# Patient Record
Sex: Female | Born: 1950 | Race: White | Hispanic: No | Smoking: Never smoker
Health system: Southern US, Community
[De-identification: ages and names within clinical notes are randomized; demographics above are authoritative.]

## PROBLEM LIST (undated history)

## (undated) DIAGNOSIS — G309 Alzheimer's disease, unspecified: Secondary | ICD-10-CM

## (undated) DIAGNOSIS — N289 Disorder of kidney and ureter, unspecified: Secondary | ICD-10-CM

## (undated) DIAGNOSIS — F0280 Dementia in other diseases classified elsewhere without behavioral disturbance: Secondary | ICD-10-CM

## (undated) HISTORY — PX: REPLACEMENT TOTAL KNEE: SUR1224

---

## 2000-05-07 ENCOUNTER — Encounter: Payer: Self-pay | Admitting: Internal Medicine

## 2000-05-07 ENCOUNTER — Ambulatory Visit (HOSPITAL_COMMUNITY): Admission: RE | Admit: 2000-05-07 | Discharge: 2000-05-07 | Payer: Self-pay | Admitting: Internal Medicine

## 2000-07-05 ENCOUNTER — Ambulatory Visit (HOSPITAL_COMMUNITY): Admission: RE | Admit: 2000-07-05 | Discharge: 2000-07-05 | Payer: Self-pay | Admitting: General Surgery

## 2000-09-02 ENCOUNTER — Encounter: Payer: Self-pay | Admitting: *Deleted

## 2000-09-02 ENCOUNTER — Emergency Department (HOSPITAL_COMMUNITY): Admission: EM | Admit: 2000-09-02 | Discharge: 2000-09-02 | Payer: Self-pay | Admitting: *Deleted

## 2000-10-08 ENCOUNTER — Ambulatory Visit (HOSPITAL_COMMUNITY): Admission: RE | Admit: 2000-10-08 | Discharge: 2000-10-08 | Payer: Self-pay | Admitting: Internal Medicine

## 2000-10-08 ENCOUNTER — Encounter: Payer: Self-pay | Admitting: Internal Medicine

## 2001-03-04 ENCOUNTER — Ambulatory Visit (HOSPITAL_COMMUNITY): Admission: RE | Admit: 2001-03-04 | Discharge: 2001-03-04 | Payer: Self-pay | Admitting: Internal Medicine

## 2001-03-04 ENCOUNTER — Encounter: Payer: Self-pay | Admitting: Internal Medicine

## 2001-07-27 ENCOUNTER — Encounter (HOSPITAL_COMMUNITY): Admission: RE | Admit: 2001-07-27 | Discharge: 2001-08-26 | Payer: Self-pay | Admitting: Internal Medicine

## 2001-09-08 ENCOUNTER — Ambulatory Visit (HOSPITAL_COMMUNITY): Admission: RE | Admit: 2001-09-08 | Discharge: 2001-09-08 | Payer: Self-pay | Admitting: Pulmonary Disease

## 2001-10-19 ENCOUNTER — Emergency Department (HOSPITAL_COMMUNITY): Admission: EM | Admit: 2001-10-19 | Discharge: 2001-10-19 | Payer: Self-pay | Admitting: Emergency Medicine

## 2001-10-19 ENCOUNTER — Encounter: Payer: Self-pay | Admitting: Internal Medicine

## 2001-11-04 ENCOUNTER — Ambulatory Visit (HOSPITAL_COMMUNITY): Admission: RE | Admit: 2001-11-04 | Discharge: 2001-11-04 | Payer: Self-pay | Admitting: Internal Medicine

## 2001-11-04 ENCOUNTER — Encounter: Payer: Self-pay | Admitting: Internal Medicine

## 2002-01-05 ENCOUNTER — Ambulatory Visit (HOSPITAL_COMMUNITY): Admission: RE | Admit: 2002-01-05 | Discharge: 2002-01-05 | Payer: Self-pay | Admitting: Pulmonary Disease

## 2002-02-28 ENCOUNTER — Ambulatory Visit (HOSPITAL_COMMUNITY): Admission: RE | Admit: 2002-02-28 | Discharge: 2002-02-28 | Payer: Self-pay | Admitting: Internal Medicine

## 2002-03-06 ENCOUNTER — Encounter: Payer: Self-pay | Admitting: General Surgery

## 2002-03-06 ENCOUNTER — Ambulatory Visit (HOSPITAL_COMMUNITY): Admission: RE | Admit: 2002-03-06 | Discharge: 2002-03-06 | Payer: Self-pay | Admitting: General Surgery

## 2002-03-24 ENCOUNTER — Ambulatory Visit (HOSPITAL_COMMUNITY): Admission: RE | Admit: 2002-03-24 | Discharge: 2002-03-24 | Payer: Self-pay | Admitting: Internal Medicine

## 2002-03-24 ENCOUNTER — Encounter: Payer: Self-pay | Admitting: Internal Medicine

## 2002-04-04 ENCOUNTER — Ambulatory Visit (HOSPITAL_COMMUNITY): Admission: RE | Admit: 2002-04-04 | Discharge: 2002-04-04 | Payer: Self-pay | Admitting: Internal Medicine

## 2002-04-04 ENCOUNTER — Encounter: Payer: Self-pay | Admitting: Internal Medicine

## 2002-10-17 ENCOUNTER — Encounter: Payer: Self-pay | Admitting: Orthopedic Surgery

## 2003-03-14 ENCOUNTER — Ambulatory Visit (HOSPITAL_COMMUNITY): Admission: RE | Admit: 2003-03-14 | Discharge: 2003-03-14 | Payer: Self-pay | Admitting: Obstetrics and Gynecology

## 2003-04-25 ENCOUNTER — Ambulatory Visit (HOSPITAL_COMMUNITY): Admission: RE | Admit: 2003-04-25 | Discharge: 2003-04-25 | Payer: Self-pay | Admitting: Surgery

## 2003-09-18 ENCOUNTER — Ambulatory Visit (HOSPITAL_COMMUNITY): Admission: RE | Admit: 2003-09-18 | Discharge: 2003-09-18 | Payer: Self-pay | Admitting: General Surgery

## 2003-09-20 ENCOUNTER — Ambulatory Visit (HOSPITAL_COMMUNITY): Admission: RE | Admit: 2003-09-20 | Discharge: 2003-09-20 | Payer: Self-pay | Admitting: General Surgery

## 2004-03-18 ENCOUNTER — Ambulatory Visit (HOSPITAL_COMMUNITY): Admission: RE | Admit: 2004-03-18 | Discharge: 2004-03-18 | Payer: Self-pay | Admitting: Internal Medicine

## 2004-03-26 ENCOUNTER — Ambulatory Visit (HOSPITAL_COMMUNITY): Admission: RE | Admit: 2004-03-26 | Discharge: 2004-03-26 | Payer: Self-pay | Admitting: Internal Medicine

## 2004-03-31 ENCOUNTER — Ambulatory Visit: Payer: Self-pay | Admitting: Internal Medicine

## 2004-04-03 ENCOUNTER — Ambulatory Visit: Payer: Self-pay | Admitting: Internal Medicine

## 2004-04-04 ENCOUNTER — Ambulatory Visit (HOSPITAL_COMMUNITY): Admission: RE | Admit: 2004-04-04 | Discharge: 2004-04-04 | Payer: Self-pay | Admitting: Internal Medicine

## 2004-04-10 ENCOUNTER — Ambulatory Visit: Payer: Self-pay | Admitting: Internal Medicine

## 2004-04-16 ENCOUNTER — Ambulatory Visit (HOSPITAL_COMMUNITY): Admission: RE | Admit: 2004-04-16 | Discharge: 2004-04-16 | Payer: Self-pay | Admitting: Internal Medicine

## 2004-05-01 ENCOUNTER — Ambulatory Visit: Payer: Self-pay | Admitting: Internal Medicine

## 2004-05-15 ENCOUNTER — Ambulatory Visit: Payer: Self-pay | Admitting: Orthopedic Surgery

## 2004-07-02 ENCOUNTER — Ambulatory Visit: Payer: Self-pay | Admitting: Orthopedic Surgery

## 2004-08-27 ENCOUNTER — Encounter: Admission: RE | Admit: 2004-08-27 | Discharge: 2004-08-27 | Payer: Self-pay | Admitting: Orthopedic Surgery

## 2004-08-28 ENCOUNTER — Ambulatory Visit (HOSPITAL_BASED_OUTPATIENT_CLINIC_OR_DEPARTMENT_OTHER): Admission: RE | Admit: 2004-08-28 | Discharge: 2004-08-28 | Payer: Self-pay | Admitting: Orthopedic Surgery

## 2004-08-28 ENCOUNTER — Ambulatory Visit (HOSPITAL_COMMUNITY): Admission: RE | Admit: 2004-08-28 | Discharge: 2004-08-28 | Payer: Self-pay | Admitting: Orthopedic Surgery

## 2004-09-29 ENCOUNTER — Encounter (HOSPITAL_COMMUNITY): Admission: RE | Admit: 2004-09-29 | Discharge: 2004-10-18 | Payer: Self-pay | Admitting: Orthopedic Surgery

## 2004-12-18 ENCOUNTER — Ambulatory Visit: Payer: Self-pay | Admitting: Internal Medicine

## 2004-12-19 ENCOUNTER — Ambulatory Visit (HOSPITAL_COMMUNITY): Admission: RE | Admit: 2004-12-19 | Discharge: 2004-12-19 | Payer: Self-pay | Admitting: Internal Medicine

## 2005-02-13 ENCOUNTER — Encounter (INDEPENDENT_AMBULATORY_CARE_PROVIDER_SITE_OTHER): Payer: Self-pay | Admitting: General Surgery

## 2005-02-14 ENCOUNTER — Inpatient Hospital Stay (HOSPITAL_COMMUNITY): Admission: RE | Admit: 2005-02-14 | Discharge: 2005-02-15 | Payer: Self-pay | Admitting: General Surgery

## 2005-03-09 ENCOUNTER — Ambulatory Visit (HOSPITAL_COMMUNITY): Admission: RE | Admit: 2005-03-09 | Discharge: 2005-03-09 | Payer: Self-pay | Admitting: Internal Medicine

## 2005-06-24 ENCOUNTER — Emergency Department (HOSPITAL_COMMUNITY): Admission: EM | Admit: 2005-06-24 | Discharge: 2005-06-24 | Payer: Self-pay | Admitting: Emergency Medicine

## 2005-07-30 ENCOUNTER — Ambulatory Visit: Payer: Self-pay | Admitting: Orthopedic Surgery

## 2005-08-27 ENCOUNTER — Ambulatory Visit: Payer: Self-pay | Admitting: Orthopedic Surgery

## 2005-09-02 ENCOUNTER — Ambulatory Visit (HOSPITAL_COMMUNITY): Admission: RE | Admit: 2005-09-02 | Discharge: 2005-09-02 | Payer: Self-pay | Admitting: Orthopedic Surgery

## 2005-09-10 ENCOUNTER — Ambulatory Visit: Payer: Self-pay | Admitting: Orthopedic Surgery

## 2005-10-14 ENCOUNTER — Ambulatory Visit (HOSPITAL_COMMUNITY): Admission: RE | Admit: 2005-10-14 | Discharge: 2005-10-14 | Payer: Self-pay | Admitting: Obstetrics & Gynecology

## 2006-03-18 ENCOUNTER — Ambulatory Visit (HOSPITAL_COMMUNITY): Admission: RE | Admit: 2006-03-18 | Discharge: 2006-03-18 | Payer: Self-pay | Admitting: Obstetrics and Gynecology

## 2006-04-02 ENCOUNTER — Encounter: Admission: RE | Admit: 2006-04-02 | Discharge: 2006-04-02 | Payer: Self-pay | Admitting: Obstetrics and Gynecology

## 2006-07-26 IMAGING — RF DG UGI W/ SMALL BOWEL
15 of 24 series · 15 of 24 positions shown · non-contrast
Comparison: none

CLINICAL DATA: Diffuse lower abdominal pain. Chronic diarrhea. Clinical
diagnosis of irritable bowel syndrome.

UPPER GI SERIES W/ HIGH-DENSITY BARIUM AND KUB AND SMALL BOWEL SERIES:
TECHNIQUE: After obtaining a scout radiograph, a double-contrast upper GI
series was performed using both high-density and thin barium. The small bowel
was then evaluated, including the terminal ileum.

[Series 1: run · 1 of 1 slices shown (1 of 15)]
[im 1/1]
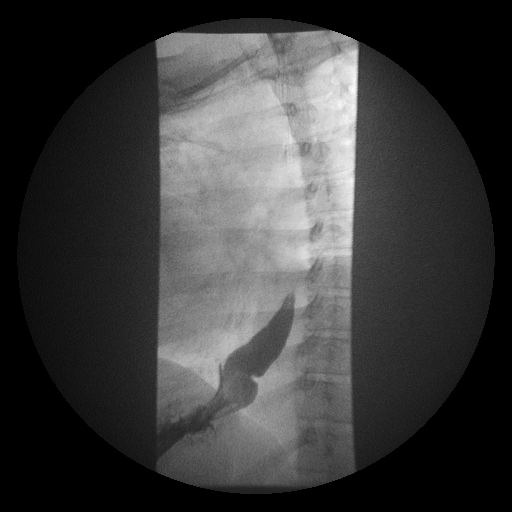

[Series 3: run · 1 of 1 slices shown (2 of 15)]
[im 1/1]
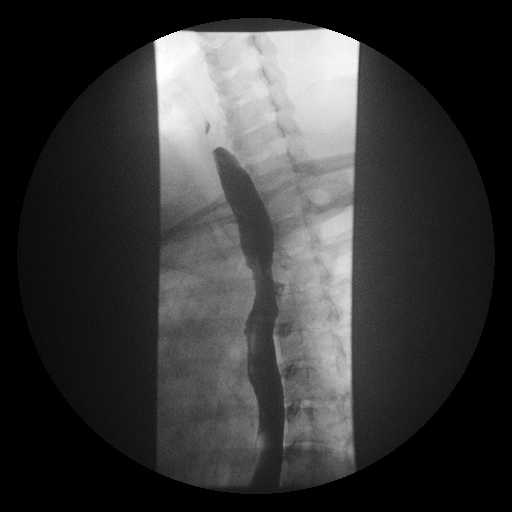

[Series 5: run · 1 of 1 slices shown (3 of 15)]
[im 1/1]
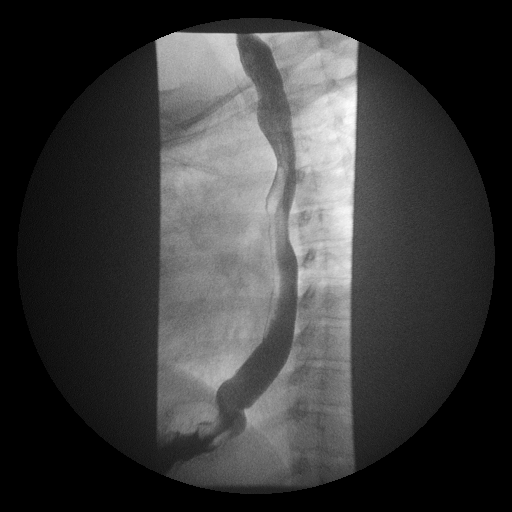

[Series 6: run · 1 of 1 slices shown (4 of 15)]
[im 1/1]
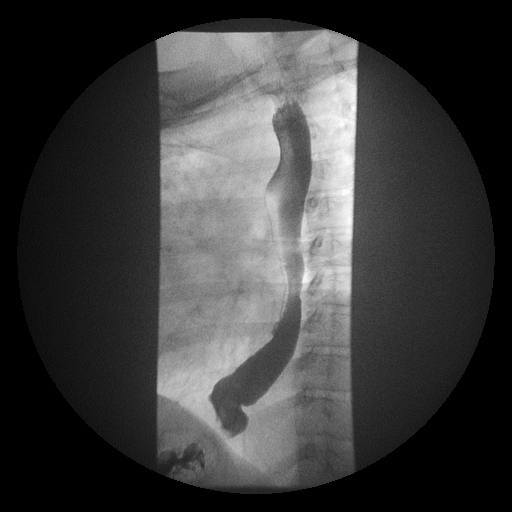

[Series 8: run · 1 of 1 slices shown (5 of 15)]
[im 1/1]
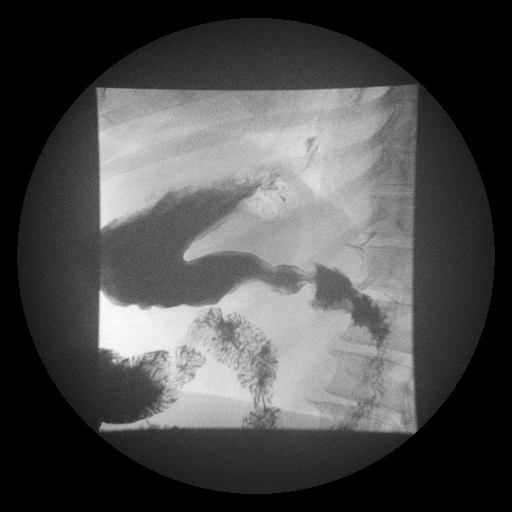

[Series 9: run · 1 of 1 slices shown (6 of 15)]
[im 1/1]
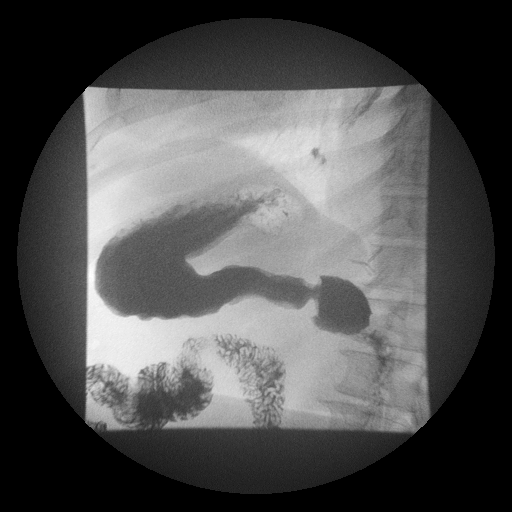

[Series 11: run · 1 of 1 slices shown (7 of 15)]
[im 1/1]
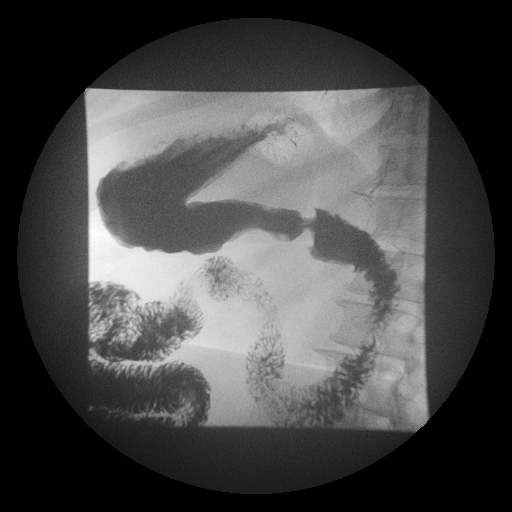

[Series 13: run · 1 of 1 slices shown (8 of 15)]
[im 1/1]
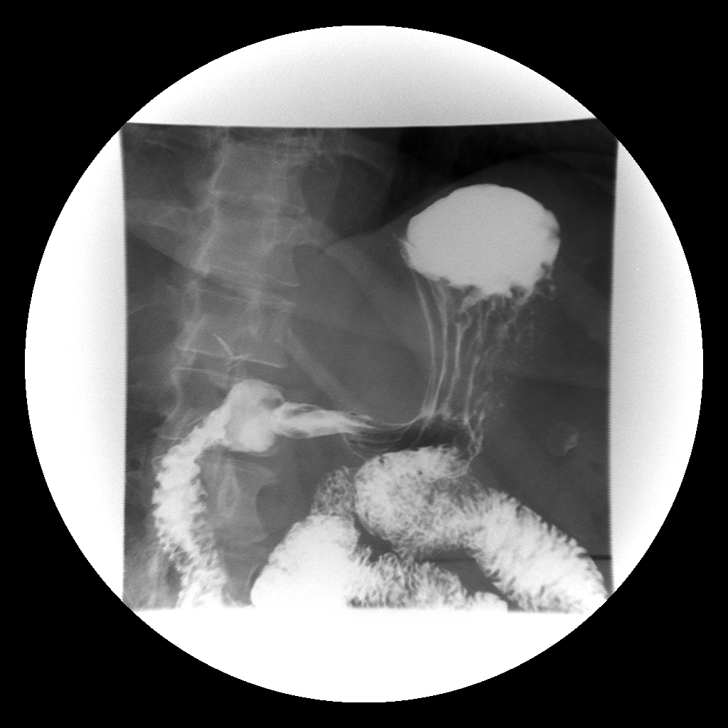

[Series 14: run · 1 of 1 slices shown (9 of 15)]
[im 1/1]
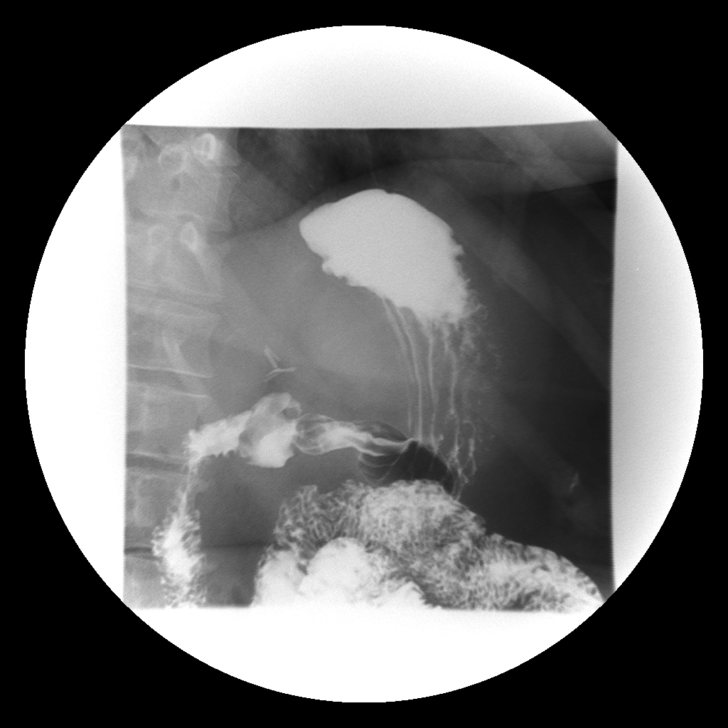

[Series 16: run · 1 of 1 slices shown (10 of 15)]
[im 1/1]
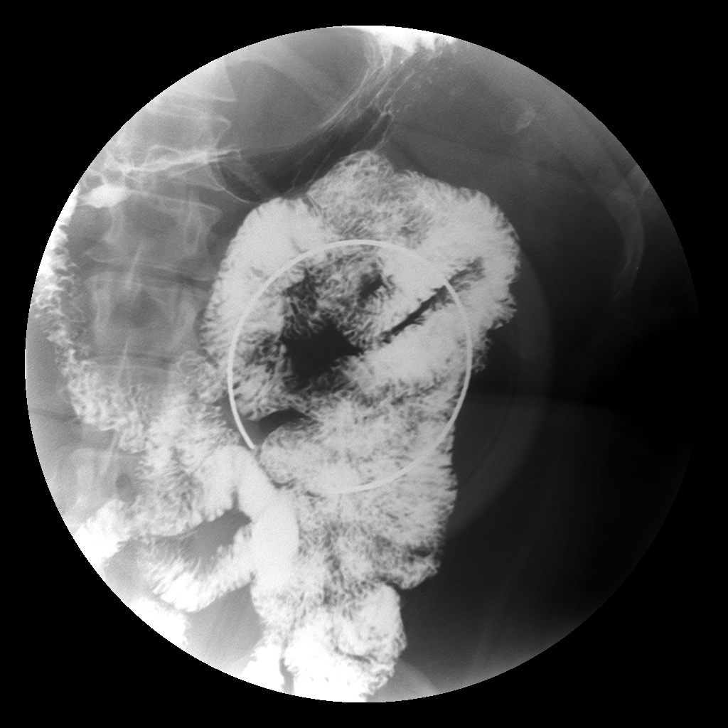

[Series 17: run · 1 of 1 slices shown (11 of 15)]
[im 1/1]
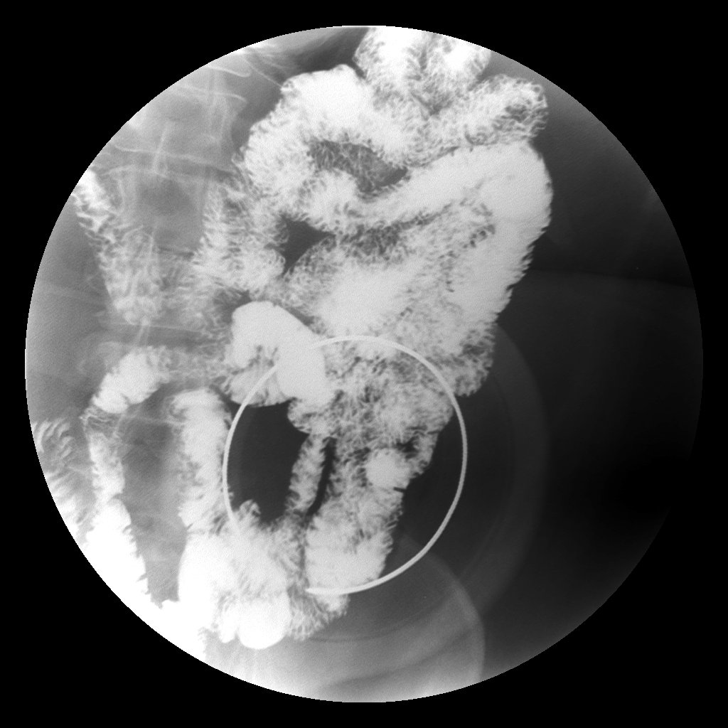

[Series 19: run · 1 of 1 slices shown (12 of 15)]
[im 1/1]
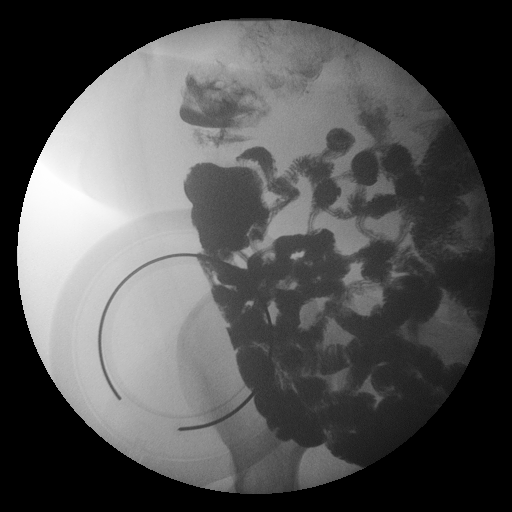

[Series 21: run · 1 of 1 slices shown (13 of 15)]
[im 1/1]
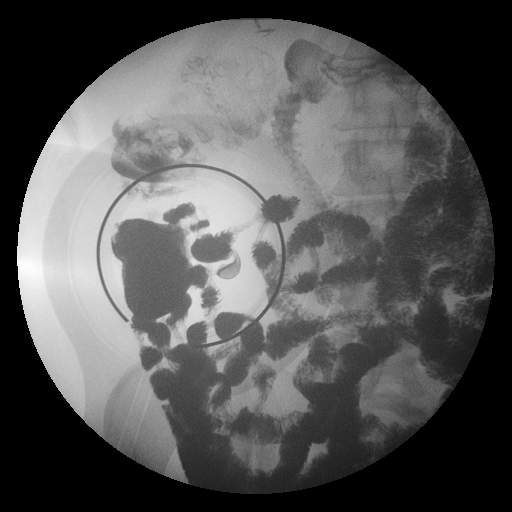

[Series 22: run · 1 of 1 slices shown (14 of 15)]
[im 1/1]
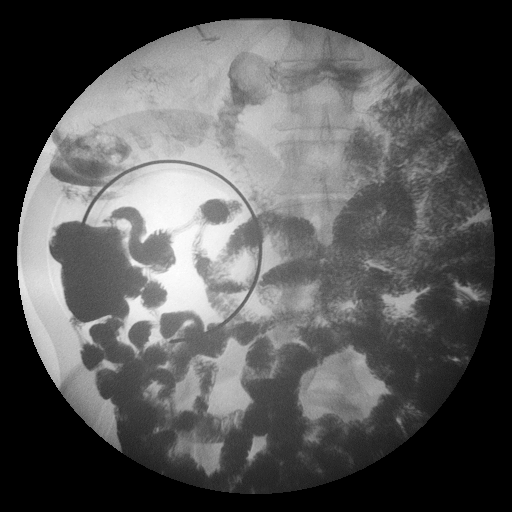

[Series 24: run · 1 of 1 slices shown (15 of 15)]
[im 1/1]
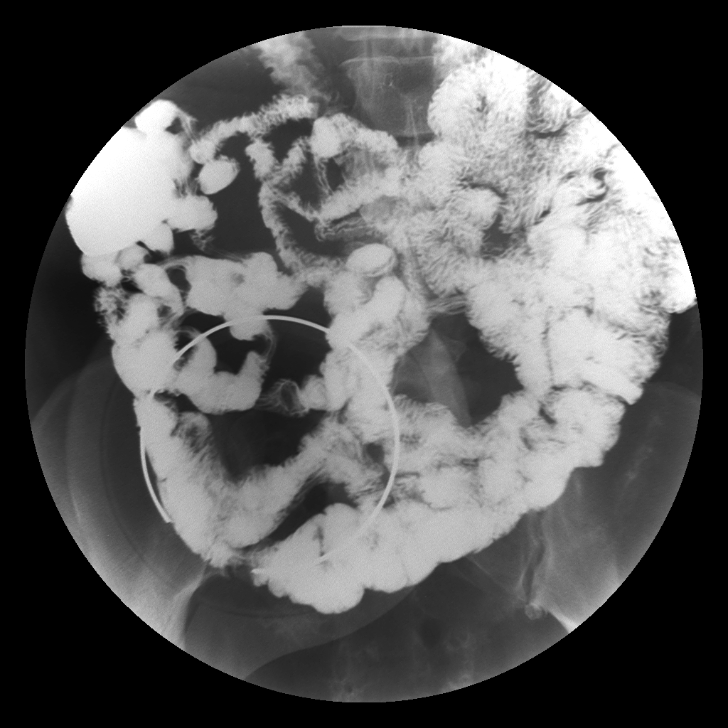

[15 of 24 positions shown; findings below may reference images not displayed]

FINDINGS: The preliminary supine radiograph of the abdomen demonstrates a
normal bowel gas pattern. Cholecystectomy clips. Mild scoliosis.

The patient swallowed barium without difficulty. Normal esophageal peristalsis.
No hypopharyngeal abnormalities. Small sliding hiatal hernia. Otherwise, normal
appearing esophagus, stomach, duodenal bulb and small bowel. The terminal ileum
is well visualized and has a normal appearance. The stomach is poorly distended.
However, the stomach was better distended on the abdomen CT dated 03/26/2004 with
no wall thickening seen at that time. No no ulcerations, masses, strictures or
gastroesophageal reflux seen during the examination.
IMPRESSION: 1. Small sliding hiatal hernia.

2. Otherwise, normal examination.

## 2006-07-28 ENCOUNTER — Encounter: Admission: RE | Admit: 2006-07-28 | Discharge: 2006-07-28 | Payer: Self-pay | Admitting: Surgery

## 2006-09-15 ENCOUNTER — Ambulatory Visit (HOSPITAL_COMMUNITY): Payer: Self-pay | Admitting: Psychiatry

## 2006-09-23 ENCOUNTER — Ambulatory Visit (HOSPITAL_COMMUNITY): Payer: Self-pay | Admitting: Psychiatry

## 2006-10-01 ENCOUNTER — Ambulatory Visit (HOSPITAL_COMMUNITY): Payer: Self-pay | Admitting: Psychiatry

## 2006-10-05 ENCOUNTER — Ambulatory Visit (HOSPITAL_COMMUNITY): Payer: Self-pay | Admitting: Psychiatry

## 2006-10-08 ENCOUNTER — Ambulatory Visit (HOSPITAL_COMMUNITY): Payer: Self-pay | Admitting: Psychiatry

## 2006-10-14 ENCOUNTER — Ambulatory Visit (HOSPITAL_COMMUNITY): Payer: Self-pay | Admitting: Psychiatry

## 2006-11-17 ENCOUNTER — Emergency Department (HOSPITAL_COMMUNITY): Admission: EM | Admit: 2006-11-17 | Discharge: 2006-11-17 | Payer: Self-pay | Admitting: Emergency Medicine

## 2006-11-17 ENCOUNTER — Encounter: Payer: Self-pay | Admitting: Orthopedic Surgery

## 2006-12-02 ENCOUNTER — Ambulatory Visit (HOSPITAL_COMMUNITY): Payer: Self-pay | Admitting: Psychiatry

## 2006-12-29 DIAGNOSIS — Z8679 Personal history of other diseases of the circulatory system: Secondary | ICD-10-CM | POA: Insufficient documentation

## 2006-12-30 ENCOUNTER — Ambulatory Visit: Payer: Self-pay | Admitting: Orthopedic Surgery

## 2006-12-30 DIAGNOSIS — M19049 Primary osteoarthritis, unspecified hand: Secondary | ICD-10-CM | POA: Insufficient documentation

## 2006-12-30 DIAGNOSIS — M1710 Unilateral primary osteoarthritis, unspecified knee: Secondary | ICD-10-CM | POA: Insufficient documentation

## 2006-12-30 DIAGNOSIS — M549 Dorsalgia, unspecified: Secondary | ICD-10-CM

## 2007-01-07 ENCOUNTER — Ambulatory Visit (HOSPITAL_COMMUNITY): Admission: RE | Admit: 2007-01-07 | Discharge: 2007-01-07 | Payer: Self-pay | Admitting: Orthopedic Surgery

## 2007-01-07 ENCOUNTER — Encounter: Payer: Self-pay | Admitting: Orthopedic Surgery

## 2007-01-07 ENCOUNTER — Telehealth: Payer: Self-pay | Admitting: Orthopedic Surgery

## 2007-01-17 ENCOUNTER — Telehealth: Payer: Self-pay | Admitting: Orthopedic Surgery

## 2007-01-19 ENCOUNTER — Encounter: Admission: RE | Admit: 2007-01-19 | Discharge: 2007-01-19 | Payer: Self-pay | Admitting: Orthopedic Surgery

## 2007-02-03 ENCOUNTER — Encounter: Payer: Self-pay | Admitting: Orthopedic Surgery

## 2007-02-03 ENCOUNTER — Encounter: Admission: RE | Admit: 2007-02-03 | Discharge: 2007-02-03 | Payer: Self-pay | Admitting: Orthopedic Surgery

## 2007-03-28 ENCOUNTER — Ambulatory Visit (HOSPITAL_COMMUNITY): Admission: RE | Admit: 2007-03-28 | Discharge: 2007-03-28 | Payer: Self-pay | Admitting: Obstetrics and Gynecology

## 2007-06-23 ENCOUNTER — Ambulatory Visit: Payer: Self-pay | Admitting: Internal Medicine

## 2007-06-23 ENCOUNTER — Encounter: Payer: Self-pay | Admitting: Orthopedic Surgery

## 2007-06-29 ENCOUNTER — Ambulatory Visit (HOSPITAL_COMMUNITY): Admission: RE | Admit: 2007-06-29 | Discharge: 2007-06-29 | Payer: Self-pay | Admitting: Internal Medicine

## 2007-06-29 ENCOUNTER — Ambulatory Visit: Payer: Self-pay | Admitting: Internal Medicine

## 2007-06-29 ENCOUNTER — Encounter: Payer: Self-pay | Admitting: Internal Medicine

## 2007-07-11 ENCOUNTER — Telehealth: Payer: Self-pay | Admitting: Orthopedic Surgery

## 2007-07-20 ENCOUNTER — Telehealth: Payer: Self-pay | Admitting: Orthopedic Surgery

## 2007-07-28 ENCOUNTER — Ambulatory Visit: Payer: Self-pay | Admitting: Orthopedic Surgery

## 2007-07-28 ENCOUNTER — Telehealth: Payer: Self-pay | Admitting: Orthopedic Surgery

## 2007-07-28 LAB — CONVERTED CEMR LAB
ALT: 13 units/L (ref 0–35)
AST: 14 units/L (ref 0–37)
Alkaline Phosphatase: 108 units/L (ref 39–117)
BUN: 15 mg/dL (ref 6–23)
Basophils Relative: 0 % (ref 0–1)
Bilirubin, Direct: 0.1 mg/dL (ref 0.0–0.3)
Chloride: 101 meq/L (ref 96–112)
Lymphs Abs: 4.6 10*3/uL — ABNORMAL HIGH (ref 0.7–4.0)
MCHC: 32.1 g/dL (ref 30.0–36.0)
Monocytes Relative: 6 % (ref 3–12)
Neutro Abs: 7.7 10*3/uL (ref 1.7–7.7)
Neutrophils Relative %: 58 % (ref 43–77)
Potassium: 4.4 meq/L (ref 3.5–5.3)
RBC: 4.76 M/uL (ref 3.87–5.11)
Sodium: 140 meq/L (ref 135–145)
WBC: 13.3 10*3/uL — ABNORMAL HIGH (ref 4.0–10.5)

## 2007-08-02 ENCOUNTER — Encounter: Payer: Self-pay | Admitting: Orthopedic Surgery

## 2007-08-02 ENCOUNTER — Ambulatory Visit (HOSPITAL_COMMUNITY): Payer: Self-pay | Admitting: Psychiatry

## 2007-08-09 ENCOUNTER — Ambulatory Visit (HOSPITAL_COMMUNITY): Payer: Self-pay | Admitting: Psychiatry

## 2007-08-16 ENCOUNTER — Ambulatory Visit: Payer: Self-pay | Admitting: Gastroenterology

## 2007-08-16 ENCOUNTER — Ambulatory Visit (HOSPITAL_COMMUNITY): Payer: Self-pay | Admitting: Psychiatry

## 2007-08-18 ENCOUNTER — Telehealth: Payer: Self-pay | Admitting: Orthopedic Surgery

## 2007-08-19 ENCOUNTER — Ambulatory Visit (HOSPITAL_COMMUNITY): Payer: Self-pay | Admitting: Psychiatry

## 2007-08-22 ENCOUNTER — Ambulatory Visit (HOSPITAL_COMMUNITY): Payer: Self-pay | Admitting: Psychiatry

## 2007-08-25 ENCOUNTER — Ambulatory Visit (HOSPITAL_COMMUNITY): Admission: RE | Admit: 2007-08-25 | Discharge: 2007-08-25 | Payer: Self-pay | Admitting: Internal Medicine

## 2007-08-25 ENCOUNTER — Encounter: Payer: Self-pay | Admitting: Orthopedic Surgery

## 2007-08-29 ENCOUNTER — Telehealth: Payer: Self-pay | Admitting: Orthopedic Surgery

## 2007-08-31 ENCOUNTER — Ambulatory Visit: Payer: Self-pay | Admitting: Orthopedic Surgery

## 2007-08-31 DIAGNOSIS — S93609A Unspecified sprain of unspecified foot, initial encounter: Secondary | ICD-10-CM | POA: Insufficient documentation

## 2007-09-01 ENCOUNTER — Ambulatory Visit (HOSPITAL_COMMUNITY): Payer: Self-pay | Admitting: Psychiatry

## 2007-09-02 ENCOUNTER — Encounter: Payer: Self-pay | Admitting: Orthopedic Surgery

## 2007-09-08 ENCOUNTER — Ambulatory Visit (HOSPITAL_COMMUNITY): Payer: Self-pay | Admitting: Psychiatry

## 2007-09-12 ENCOUNTER — Telehealth: Payer: Self-pay | Admitting: Orthopedic Surgery

## 2007-09-19 ENCOUNTER — Telehealth: Payer: Self-pay | Admitting: Orthopedic Surgery

## 2007-09-19 ENCOUNTER — Emergency Department (HOSPITAL_COMMUNITY): Admission: EM | Admit: 2007-09-19 | Discharge: 2007-09-19 | Payer: Self-pay | Admitting: Emergency Medicine

## 2007-09-27 ENCOUNTER — Ambulatory Visit (HOSPITAL_COMMUNITY): Admission: RE | Admit: 2007-09-27 | Discharge: 2007-09-28 | Payer: Self-pay | Admitting: Internal Medicine

## 2007-09-30 ENCOUNTER — Ambulatory Visit: Payer: Self-pay | Admitting: Cardiology

## 2007-11-24 ENCOUNTER — Ambulatory Visit (HOSPITAL_COMMUNITY): Payer: Self-pay | Admitting: Psychiatry

## 2007-11-29 ENCOUNTER — Ambulatory Visit (HOSPITAL_COMMUNITY): Payer: Self-pay | Admitting: Psychiatry

## 2007-12-01 ENCOUNTER — Ambulatory Visit (HOSPITAL_COMMUNITY): Payer: Self-pay | Admitting: Psychiatry

## 2007-12-12 ENCOUNTER — Ambulatory Visit (HOSPITAL_COMMUNITY): Payer: Self-pay | Admitting: Psychiatry

## 2007-12-20 ENCOUNTER — Ambulatory Visit (HOSPITAL_COMMUNITY): Payer: Self-pay | Admitting: Psychiatry

## 2007-12-21 ENCOUNTER — Ambulatory Visit (HOSPITAL_COMMUNITY): Payer: Self-pay | Admitting: Psychiatry

## 2008-01-04 ENCOUNTER — Ambulatory Visit (HOSPITAL_COMMUNITY): Payer: Self-pay | Admitting: Psychiatry

## 2008-01-24 ENCOUNTER — Ambulatory Visit (HOSPITAL_COMMUNITY): Payer: Self-pay | Admitting: Psychiatry

## 2008-02-01 ENCOUNTER — Encounter: Payer: Self-pay | Admitting: Orthopedic Surgery

## 2008-02-02 ENCOUNTER — Ambulatory Visit: Payer: Self-pay | Admitting: Orthopedic Surgery

## 2008-02-02 DIAGNOSIS — M5137 Other intervertebral disc degeneration, lumbosacral region: Secondary | ICD-10-CM

## 2008-02-02 DIAGNOSIS — M51379 Other intervertebral disc degeneration, lumbosacral region without mention of lumbar back pain or lower extremity pain: Secondary | ICD-10-CM | POA: Insufficient documentation

## 2008-02-03 ENCOUNTER — Ambulatory Visit (HOSPITAL_COMMUNITY): Payer: Self-pay | Admitting: Psychiatry

## 2008-02-07 ENCOUNTER — Telehealth: Payer: Self-pay | Admitting: Orthopedic Surgery

## 2008-02-07 ENCOUNTER — Encounter: Payer: Self-pay | Admitting: Orthopedic Surgery

## 2008-02-10 ENCOUNTER — Encounter: Payer: Self-pay | Admitting: Orthopedic Surgery

## 2008-02-14 ENCOUNTER — Encounter: Payer: Self-pay | Admitting: Orthopedic Surgery

## 2008-02-17 ENCOUNTER — Ambulatory Visit (HOSPITAL_COMMUNITY): Admission: RE | Admit: 2008-02-17 | Discharge: 2008-02-17 | Payer: Self-pay | Admitting: Neurosurgery

## 2008-02-21 ENCOUNTER — Telehealth: Payer: Self-pay | Admitting: Orthopedic Surgery

## 2008-03-01 ENCOUNTER — Ambulatory Visit (HOSPITAL_COMMUNITY): Admission: RE | Admit: 2008-03-01 | Discharge: 2008-03-01 | Payer: Self-pay | Admitting: Internal Medicine

## 2008-03-16 ENCOUNTER — Ambulatory Visit (HOSPITAL_COMMUNITY): Payer: Self-pay | Admitting: Psychiatry

## 2008-03-20 ENCOUNTER — Ambulatory Visit (HOSPITAL_COMMUNITY): Payer: Self-pay | Admitting: Psychiatry

## 2008-03-30 ENCOUNTER — Ambulatory Visit (HOSPITAL_COMMUNITY): Payer: Self-pay | Admitting: Psychiatry

## 2008-04-24 ENCOUNTER — Ambulatory Visit (HOSPITAL_COMMUNITY): Payer: Self-pay | Admitting: Psychiatry

## 2008-05-02 ENCOUNTER — Ambulatory Visit (HOSPITAL_COMMUNITY): Payer: Self-pay | Admitting: Psychiatry

## 2008-05-18 ENCOUNTER — Ambulatory Visit (HOSPITAL_COMMUNITY): Payer: Self-pay | Admitting: Psychiatry

## 2008-05-21 ENCOUNTER — Ambulatory Visit (HOSPITAL_COMMUNITY): Admission: RE | Admit: 2008-05-21 | Discharge: 2008-05-21 | Payer: Self-pay | Admitting: Internal Medicine

## 2008-05-25 ENCOUNTER — Encounter: Payer: Self-pay | Admitting: Orthopedic Surgery

## 2008-06-01 ENCOUNTER — Ambulatory Visit (HOSPITAL_COMMUNITY): Payer: Self-pay | Admitting: Psychiatry

## 2008-06-12 ENCOUNTER — Ambulatory Visit (HOSPITAL_COMMUNITY): Payer: Self-pay | Admitting: Psychiatry

## 2008-07-25 ENCOUNTER — Telehealth: Payer: Self-pay | Admitting: Orthopedic Surgery

## 2008-08-16 ENCOUNTER — Ambulatory Visit (HOSPITAL_COMMUNITY): Payer: Self-pay | Admitting: Psychiatry

## 2008-09-13 ENCOUNTER — Ambulatory Visit (HOSPITAL_COMMUNITY): Payer: Self-pay | Admitting: Psychiatry

## 2008-09-22 ENCOUNTER — Emergency Department (HOSPITAL_COMMUNITY): Admission: EM | Admit: 2008-09-22 | Discharge: 2008-09-22 | Payer: Self-pay | Admitting: Emergency Medicine

## 2008-09-27 ENCOUNTER — Ambulatory Visit (HOSPITAL_COMMUNITY): Payer: Self-pay | Admitting: Psychiatry

## 2009-02-14 ENCOUNTER — Ambulatory Visit (HOSPITAL_COMMUNITY): Payer: Self-pay | Admitting: Psychiatry

## 2009-03-07 ENCOUNTER — Ambulatory Visit (HOSPITAL_COMMUNITY): Payer: Self-pay | Admitting: Psychiatry

## 2009-04-04 ENCOUNTER — Ambulatory Visit (HOSPITAL_COMMUNITY): Payer: Self-pay | Admitting: Psychiatry

## 2009-04-16 ENCOUNTER — Ambulatory Visit (HOSPITAL_COMMUNITY): Payer: Self-pay | Admitting: Psychology

## 2009-04-30 ENCOUNTER — Ambulatory Visit (HOSPITAL_COMMUNITY): Payer: Self-pay | Admitting: Psychology

## 2009-08-06 ENCOUNTER — Ambulatory Visit (HOSPITAL_COMMUNITY): Payer: Self-pay | Admitting: Psychiatry

## 2009-09-03 ENCOUNTER — Ambulatory Visit (HOSPITAL_COMMUNITY): Payer: Self-pay | Admitting: Psychiatry

## 2009-09-05 ENCOUNTER — Ambulatory Visit: Payer: Self-pay | Admitting: Internal Medicine

## 2009-09-05 ENCOUNTER — Encounter (INDEPENDENT_AMBULATORY_CARE_PROVIDER_SITE_OTHER): Payer: Self-pay | Admitting: *Deleted

## 2009-09-05 DIAGNOSIS — R197 Diarrhea, unspecified: Secondary | ICD-10-CM

## 2009-09-05 DIAGNOSIS — K589 Irritable bowel syndrome without diarrhea: Secondary | ICD-10-CM

## 2009-09-05 DIAGNOSIS — K219 Gastro-esophageal reflux disease without esophagitis: Secondary | ICD-10-CM

## 2009-09-05 DIAGNOSIS — R109 Unspecified abdominal pain: Secondary | ICD-10-CM | POA: Insufficient documentation

## 2009-09-17 ENCOUNTER — Encounter: Payer: Self-pay | Admitting: Internal Medicine

## 2009-10-03 ENCOUNTER — Ambulatory Visit: Payer: Self-pay | Admitting: Internal Medicine

## 2009-10-14 ENCOUNTER — Telehealth (INDEPENDENT_AMBULATORY_CARE_PROVIDER_SITE_OTHER): Payer: Self-pay

## 2009-10-15 ENCOUNTER — Encounter: Payer: Self-pay | Admitting: Internal Medicine

## 2009-10-15 LAB — CONVERTED CEMR LAB: Tissue Transglutaminase Ab, IgA: 10 units (ref <20)

## 2009-10-21 ENCOUNTER — Ambulatory Visit: Payer: Self-pay | Admitting: Internal Medicine

## 2009-10-21 ENCOUNTER — Ambulatory Visit (HOSPITAL_COMMUNITY): Admission: RE | Admit: 2009-10-21 | Discharge: 2009-10-21 | Payer: Self-pay | Admitting: Internal Medicine

## 2009-10-23 DIAGNOSIS — Z860100 Personal history of colon polyps, unspecified: Secondary | ICD-10-CM | POA: Insufficient documentation

## 2009-10-29 ENCOUNTER — Ambulatory Visit (HOSPITAL_COMMUNITY): Payer: Self-pay | Admitting: Psychiatry

## 2009-12-02 ENCOUNTER — Ambulatory Visit: Payer: Self-pay | Admitting: Internal Medicine

## 2009-12-11 ENCOUNTER — Ambulatory Visit (HOSPITAL_COMMUNITY): Admission: RE | Admit: 2009-12-11 | Discharge: 2009-12-11 | Payer: Self-pay | Admitting: Obstetrics and Gynecology

## 2009-12-26 ENCOUNTER — Ambulatory Visit (HOSPITAL_COMMUNITY): Payer: Self-pay | Admitting: Psychiatry

## 2010-01-12 ENCOUNTER — Emergency Department (HOSPITAL_COMMUNITY)
Admission: EM | Admit: 2010-01-12 | Discharge: 2010-01-12 | Payer: Self-pay | Source: Home / Self Care | Admitting: Emergency Medicine

## 2010-02-08 ENCOUNTER — Encounter: Payer: Self-pay | Admitting: Obstetrics and Gynecology

## 2010-02-09 ENCOUNTER — Encounter: Payer: Self-pay | Admitting: Orthopedic Surgery

## 2010-02-09 ENCOUNTER — Encounter: Payer: Self-pay | Admitting: Obstetrics and Gynecology

## 2010-02-09 ENCOUNTER — Encounter: Payer: Self-pay | Admitting: Internal Medicine

## 2010-02-10 ENCOUNTER — Encounter: Payer: Self-pay | Admitting: Orthopedic Surgery

## 2010-02-18 NOTE — Assessment & Plan Note (Signed)
Summary: PT C/O LOWER ABDOMINAL PAIN/LAW   Visit Type:  f/u Primary Care Cylah Fannin:  Ouida Sills  Chief Complaint:  Lower abd pain.  History of Present Illness: Shirley [name] is here for f/u of abd pain. She has long h/o GERD, IBS.   Having problems with stomach again over last one year. For past six months, every time has BM, very foul odor. Abd pain from both flanks around mid-section. Increased pain with sitting. Feels knots in it. Pain happens regardless to meals. Takes pain pills at night (tylenol codeine) at times and this helps sleep. No nocturnal symptoms. About 4-5 stools postprandially. Black watery stool for 1-2 weeks, very bad smell then. But this stopped. Having intermittent heartburn, takes Alka-seltzer. TUMS causes heartburn. Some issues swallowing, pain in throat. Nothing seen on exam by Dr. Ouida Sills. Feels like food going to come up. Lasted about one month.   Current Medications (verified): 1)  Lamictal 100 Mg  Tabs (Lamotrigine) .... As Needed 2)  Paroxetine Hcl 10 Mg  Tabs (Paroxetine Hcl) .... Take 1 Tablet By Mouth Once A Day 3)  Amlodipine Besylate 5 Mg Tabs (Amlodipine Besylate) .... Take 1 Tablet By Mouth Once A Day 4)  Estrace 0.5 Mg Tabs (Estradiol) .... Take 1 Tablet By Mouth Once A Day 5)  Metformin Hcl 1000 Mg Tabs (Metformin Hcl) .... Take 1 Tablet By Mouth Two Times A Day 6)  Alprazolam 0.5 Mg Tabs (Alprazolam) .... Take 1 Tablet By Mouth Two Times A Day 7)  Haloperidol 2 Mg Tabs (Haloperidol) .... Take 1 Tab By Mouth At Bedtime 8)  Lithium Carbonate 300 Mg Caps (Lithium Carbonate) .... One Tablet in The Am and Two Tablets At Bedtime 9)  Benztropine Mesylate 0.5 Mg Tabs (Benztropine Mesylate) .... Take 1 Tab By Mouth At Bedtime  Allergies (verified): 1)  ! Buspar 2)  ! Celebrex 3)  ! * Oxycodone 4)  ! Vasotec 5)  ! Vioxx 6)  ! * Predisone 7)  ! * Hctz 8)  ! Keflex  Past History:  Past Medical History: arthritis diabetic High Blood Pressure manic  depressive bipolar djd disk problems ptsd Recent URI, early spring, two rounds of ABX Insomnia EGD/TCS, 6/09, Dr. Dellie Catholic ERE, single antral erosions, left-sided trv tics, pedunculated polyp in mid-desc colon (tub adenoma), next TCS 06/2012. IBS TCS, 2004, Dr. Rourk-->left-sided diverticular dz  Past Surgical History: hysterectomy Cholecystectomy left lumpectomy abd hernia repair appy ?lysis of adhesions?  Family History: Father, colon cancer in his 25s Brother, colon cancer in his 10s "everyone" has stomach issues No liver dz.  Social History: No tob, alcohol, drug use. Two children. Divorced. 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 Worked at [employer] for period of time, but did not tolerate pace (due to psychiatric illnesses).  Review of Systems General:  Denies fever, chills, sweats, anorexia, weakness, and weight loss. Eyes:  Denies vision loss. ENT:  Complains of sore throat and difficulty swallowing; denies nasal congestion and hoarseness. CV:  Complains of peripheral edema; denies chest pains, angina, palpitations, and dyspnea on exertion. Resp:  Denies dyspnea at rest, dyspnea with exercise, cough, sputum, and wheezing. GI:  See HPI. GU:  Denies urinary burning and blood in urine. MS:  Complains of low back pain; denies joint pain / LOM. Derm:  Denies rash and itching. Neuro:  Denies weakness, frequent headaches, memory loss, and confusion. Psych:  Complains of depression and anxiety; denies memory loss and suicidal ideation. Endo:  Denies unusual weight change. Heme:  Denies bruising  and bleeding. Allergy:  Denies hives and rash.  Vital Signs:  Patient profile:   60 year old female Height:      64 inches Weight:      270.50 pounds BMI:     46.60 Temp:     98.4 degrees F oral Pulse rate:   72 / minute BP sitting:   130 / 80  (right arm) Cuff size:   large  Vitals Entered By: Cloria Spring LPN (September 05, 2009 11:07 AM)  Physical  Exam  General:  Well developed, well nourished, no acute distress.obese.   Head:  Normocephalic and atraumatic. Eyes:  sclera nonicteric Mouth:  Oropharyngeal mucosa moist, pink.  No lesions, erythema or exudate.    Lungs:  Clear throughout to auscultation. Heart:  Regular rate and rhythm; no murmurs, rubs,  or bruits. Abdomen:  Normal BS. Obese. Mild diffuse left sided abd tenderness. No rebound or guarding. No HSM or masses. Exam limited due to body habitus. No abd bruit or hernia. Extremities:  No clubbing, cyanosis, edema or deformities noted. Neurologic:  Alert and  oriented x4;  grossly normal neurologically. Skin:  Intact without significant lesions or rashes. Psych:  Alert and cooperative. Normal mood and affect.  Impression & Recommendations:  Problem # 1:  ABDOMINAL PAIN, UNSPECIFIED SITE (ICD-789.00) Left-sided abd pain and pp loose stools, likely secondary to IBS. Given Abx use, will check stool studies. Will obtain labs done at Dr. Alonza Smoker office this week. Start probiotic once daily, #30 Dig Adv given. If stools negative, then will try antispasmotic. No need for TCS at this time. If symptoms ongoing without explanation, then consider w/u such as CT A/P with iv/oral contrast. OV in 6-8 weeks with Dr. Jena Gauss.  Orders: Est. Patient Level II (16109)  Problem # 2:  GERD (ICD-530.81) Recent gerd symptoms and some dysphagia. No longer on PPI. Lack of insurance. Start Prevacid 24hr one daily, before meal. #30 samples given.  Orders: Est. Patient Level II [phone])  Other Orders: T-Culture, Stool ([id number]) T-Stool Giardia / Crypto- EIA (09811) T-Culture, C-Diff Toxin A/B ([id number])  Appended Document: PT C/O LOWER ABDOMINAL PAIN/LAW F/U OV IN 3-8 WKS IS IN THE COMPUTER/LAW

## 2010-02-18 NOTE — Miscellaneous (Signed)
Summary: procedure notes,path reports and h&p  Clinical Lists Changes NAME:  LARESHA, BACORN               ACCOUNT NO.:  [id number]      MEDICAL RECORD NO.:  [id number]          PATIENT TYPE:  AMB      LOCATION:  DAY                           FACILITY:  [facility]      PHYSICIAN:  R. Roetta Sessions, M.D. DATE OF BIRTH:  16-Jul-1950      DATE OF PROCEDURE:  06/29/2007   DATE OF DISCHARGE:                                  OPERATIVE REPORT     INDICATIONS FOR PROCEDURE:  A 60 year old lady with positive family   history of colon cancer to first-degree relatives, history of irritable   bowel syndrome with constipation predominant recently vague left-sided   abdominal pain.  EGD and colonoscopy now being done.  She has had some   refractory gastroesophageal reflux disease symptoms, has not been on   regular acid suppression.  We will start her on some Nexium 40 mg orally   and through the office recently with significant improvement of reflux   symptoms.  She is not having odynophagia or dysphagia.  Risks, benefits,   alternatives, and limitations have been reviewed.  Questions answered,   all parties agreeable.  Please see documentation medical record.      PROCEDURE NOTE:  O2 saturation, blood pressure, pulse, respirations were   monitored throughout the entire procedure.  Conscious sedation, Versed 4   mg, IV Demerol 100 mg, IV divided doses, Phenergan 12.5 mg IV diluted   slow IV push to augment conscious sedation, Cetacaine spray for topical   pharyngeal anesthesia.      FINDINGS:  EGD examination tubular esophagus that revealed a couple of   tiny distal esophageal erosions.  There was no Barrett's esophagus or   other abnormality.  EGD junction easily traversed.  Stomach, gastric   cavity was emptied and insufflated well with air.  Thorough examination   of the gastric mucosa including retroflexion of the proximal stomach,   esophagogastric junction demonstrated solitary prepyloric antral     erosions, otherwise the gastric mucosa appeared entirely normal.   Pylorus was patent, easily traversed.  Examination of the bulb and   second portion revealed no abnormalities.      THERAPEUTIC/DIAGNOSTIC MANEUVERS PERFORMED:  None.      The patient tolerated the procedure well.  For colonoscopy, digital   rectal exam revealed no abnormalities.  Endoscopic findings.  Prep was   suboptimal, but doable.  Colon:  Colonic mucosa was surveyed from   rectosigmoid junction through the left transverse, right colon,   appendiceal orifice, ileocecal valve, and cecum.  These structures were   all seen and photographed for the record.  Terminal ileum was then made   5 cm from this level, the scope was slowly cautiously withdrawn.  All   previously mentioned mucosal surfaces were again seen.  The patient had   left-sided transverse diverticula.  There was a 1-cm pedunculated polyp   in the mid descending colon, which was removed with  the hot snare   cautery and recovered through the  scope.  The remainder of colonic   mucosa and terminal ileal mucosa appeared normal.  The scope was pulled   down to the rectum where a thorough examination of the rectal mucosa   including retroflexed view of the anal verge demonstrated no   abnormalities.  The patient tolerated both procedures well and was   reactive in the endoscopy.      IMPRESSION:  EGD.  Tiny distal esophageal erosions consistent with mild   erosive reflux esophagitis, single antral erosions otherwise   unremarkable stomach, patent pylorus, normal D1 and D2.  Colonoscopy   findings, normal rectum, left-sided transverse diverticula, pedunculated   polyp, mid descending colon removed with the snare.  Remainder colonic   mucosa and terminal ileal mucosa appeared normal.      RECOMMENDATIONS:   1. Continue Nexium 40 g orally daily, would continue this for both       gastroesophageal reflux disease and site protection given that she       is on  nonsteroidal agents and did have a single, likely NSAID       induced, gastric erosion.   2. Daily Metamucil or Citrucel fiber supplement.   3. MiraLax 70 g orally at bedtime p.r.n. constipation.   4. Follow up on path.   5. Further recommendations to follow.               Jonathon Bellows, M.D.   Electronically Signed            RMR/MEDQ  D:  06/29/2007  T:  06/30/2007  Job:  [id number]      cc:   Kingsley Callander. Ouida Sills, MD   Fax: [phone]  SP-Surgical Pathology - STATUS: Final  .                                         Perform Date: 10Jun09 00:01  Ordered By: Jena Gauss MD , Gerrit Friends           Ordered Date: 11Jun09 13:42  Facility: [facility]                               Department: CPATH  Service Report Text  [facility]   [address] [place], [place] [address]   [phone]    REPORT OF SURGICAL PATHOLOGY    Case #: [id number]   Patient Name: Shirley [name], Shirley [name].   Office Chart Number: N/A    MRN: [id number]   Pathologist: H. Hollice Espy, MD   DOB/Age [birth year] (Age: 27) Gender: F   Date Taken: 06/29/2007   Date Received: 06/30/2007    FINAL DIAGNOSIS    ***MICROSCOPIC EXAMINATION AND DIAGNOSIS***    COLON, DESCENDING, POLYP, BIOPSY: TUBULAR ADENOMA (MULTIPLE   FRAGMENTS). NO HIGH GRADE DYSPLASIA OR MALIGNANCY IDENTIFIED.    COMMENT   There is adenomatous epithelium having a predominantly tubular   growth pattern consistent with a tubular adenoma if the biopsy is   representative of the entire lesion. No high grade dysplasia or   evidence of malignancy is identified. Clinical correlation is   recommended.   It is not clear whether this represents the entire lesion noted   clinically or whether this is a portion of a larger lesion in   which case the changes here may or may not  be representative.    kv   Date Reported: 07/01/2007 H. Hollice Espy, MD   *** Electronically Signed Out By [facility] ***    Clinical information   Diverticulosis; family hx colon  cancer (km)    specimen(s) obtained   Colon, polyp(s), descending    Gross Description   Received in formalin are multiple irregular and polypoid tan red   tissues aggregating 1 x 1 x 0.3 cm which are entirely submitted   in one block. (SSW:cdc 06/30/07)    cc/   Additional Information  [id number] RESULT STATUS : F  External IF Update Timestamp : 2007-06-30:13:44:[id number]    NAME:  AMORIE, RENTZ               ACCOUNT NO.:  [id number]      MEDICAL RECORD NO.:  [id number]          PATIENT TYPE:  AMB      LOCATION:  DAY                           FACILITY:  [facility]      PHYSICIAN:  R. Roetta Sessions, M.D. DATE OF BIRTH:  02/14/1950      DATE OF ADMISSION:   DATE OF DISCHARGE:  LH                                 HISTORY & PHYSICAL     CHIEF COMPLAINT:   1. Positive family history of colon cancer of two first-degree       relatives.   2. History of diverticulosis, need for high risk screening       colonoscopy.   3. Left-sided abdominal pain.      HISTORY OF PRESENT ILLNESS:  Ms. Lior Cartelli is a pleasant 59 year old   Caucasian female works as Diplomatic Services operational officer at [employer].  She   came to see me today for followup.  She had a long history of IBS-D over   the past couple years, has now become constipated.  She had a   colonoscopy back in 2004, by me for a larger colorectal cancer screen.   She had some left-sided diverticula.  She has positive family history in   her dad, who was diagnosed with disease in his 39s and a brother who was   diagnosed with colon cancer at age 21.  She has not passed any blood per   rectum.  She is having a difficult time having a bowel movement.  She   mainly have a couple of bowel movements weekly and has taken magnesium   citrate on occasion.  She has had some vague left-sided abdominal pain,   but more temporally related to constipation when she moves her bowels.   Those symptoms settle down.      She does have longstanding gastroesophageal  reflux disease, describes   having an EGD several years ago, and is having reflux symptoms almost   daily.  She is not on any acid suppression therapy at this time.  She   does not have any odynophagia or dysphagia.  She has actually lost 4   pounds since I last saw her back in March 2006.  Prior EGD by Dr. Katrinka Blazing   reportedly was normal.  She has moved from [facility] to [facility] down in [place], and she tells me it is  a very fast paced   environment and it is causing her to get stressed out as she feels it   maybe making her physically sick.      PAST MEDICAL HISTORY:   1. Hypertension.   2. Bipolar anxiety.   3. Neurosis.      PAST SURGERIES:   1. Hysterectomy.   2. Cholecystectomy.   3. Appendectomy.   4. Left lumpectomy.   5. Hernia repair.      CURRENT MEDICATIONS:   1. Nabumetone 500 mg b.i.d.   2. Hydrocodone 5/500.   3. Paroxetine 10 mg daily.   4. Amlodipine 5 mg daily.   5. Estradiol 0.5 mg daily.   6. Alprazolam 0.5 mg b.i.d.   7. Advil p.r.n.Marland Kitchen      ALLERGIES:  PREDNISONE, OXYCODONE, VASOTEC, HYDROCHLOROTHIAZIDE, AND   KEFLEX.      FAMILY HISTORY:  Most significant as outlined above.  Mother had   Alzheimer's.      SOCIAL HISTORY:  The patient endorses she works for [employer].  No tobacco, no alcohol, and no illicit drugs.      REVIEW OF SYSTEMS:  No chest pain or dyspnea on exertion.  No fever or   chills.  No odynophagia, no dysphagia, and no nausea or vomiting.      PHYSICAL EXAMINATION:  GENERAL:  Pleasant 57-year lady, resting   comfortably.   VITAL SIGNS:  Height 5 feet 4 inches, temperature 98.2, BP 130/90, and   pulse 80.   SKIN:  Warm and dry.  There is no jaundice.   HEENT:  No scleral icterus.   CHEST:  Lungs are clear to auscultation.   CARDIAC:  Regular rate and rhythm without murmur, gallop, or rub.   ABDOMEN:  Nondistended.  Positive bowel sounds.  Soft.  She has minimal   epigastric tenderness to  palpation.  No obvious mass or organomegaly.   RECTAL:  Deferred at the time of colonoscopy.      IMPRESSION:  [name] is a pleasant 60 year old lady with a   positive family history of colon cancer in two first-degree relatives,   one at relatively young age.  She has a history of irritable bowel   syndrome, but has been constipated over the past couple of years, has   had some vague left-sided abdominal pain, thought to be related to   constipation.  She also has untreated gastroesophageal reflux disease,   symptoms frequently on a daily basis without any alarm features.      RECOMMENDATIONS:  She needs a high risk screening colonoscopy in the   near future.  Risks, benefits, alternatives, and limitations have been   reviewed.  Questions answered and she is agreeable.  Since it has been a   long time since she has had an EGD and given her epigastric tenderness,   I have offered her an EGD at the same time as TCS.  Risks, benefits, and   alternatives again were reviewed and questions were answered, and she is   agreeable.  In the interim, we will go and start her on some Nexium 40   mg pill once daily, samples x30.  In the interim, I will make further   recommendations in the very near future.               Jonathon Bellows, M.D.   Electronically Signed            RMR/MEDQ  D:  06/23/2007  T:  06/24/2007  Job:  [id number]      cc:   Kingsley Callander. Ouida Sills, MD   Fax: [phone]      NAME:  Shirley [name], Shirley [name]                         ACCOUNT NO.:  [id number]   MEDICAL RECORD NO.:  [id number]                   PATIENT TYPE:  AMB   LOCATION:  DAY                                  FACILITY:  [facility]   PHYSICIAN:  R. Roetta Sessions, M.D.              DATE OF BIRTH:  1950-10-30   DATE OF PROCEDURE:  02/28/2002  DATE OF DISCHARGE:                                 OPERATIVE REPORT   PROCEDURE:  Diagnostic/screening colonoscopy.   INDICATIONS FOR PROCEDURE:  The patient is a 60 year old lady  with  alternating constipation and diarrhea, and marked family history of  colorectal cancer x4.  Colonoscopy has been discussed previously.  Potential  risks, benefits, and alternatives have been reviewed and questions answered.  Please see the documentation in the medical record.   PROCEDURE NOTE:  The O2 saturation, blood pressure, pulses, and respirations  were monitored throughout the entire procedure.  Conscious sedation:  Versed  5 mg IV, Demerol 100 mg IV, in divided doses.  Instrument:  Olympus video  tip adult colonoscope.  Findings:  Digital rectal exam revealed no  abnormalities.  Prep was good.  Rectal:  Examination of the rectal mucosa  including retroflexion at the anal verge revealed no abnormalities.  The  colonic mucosa was surveyed from the rectosigmoid junction, through the  left, transverse, and right colon, to the area of the appendiceal orifice,  ileocecal valve, and cecum.  These structures were well seen and  photographed for the record.  The patient was noted to have left-sided  diverticula.  The remainder of the colonic mucosa appeared normal from the  level of the cecum and the ileocecal valve.  The scope was slowly withdrawn.  All previously-mentioned mucosal surfaces were again seen.  No other  abnormalities were observed.  The patient tolerated the procedure well and  was reacted in endoscopy.   IMPRESSION:  1. Normal rectum.  2. Left-sided diverticula, remainder of colonic mucosa appeared normal.  3. Suspect the patient has irritable bowel syndrome.   RECOMMENDATIONS:  1. Repeat colonoscopy in five years for high-risk screening purposes.  2.     Diverticulosis literature applied to the patient.  3. Reinstitute antispasmodic therapy to low dose; i.e. Levbid 1/2 tablet     p.o. each morning.  4. Follow up with me in the office in six weeks.                                               Jonathon Bellows, M.D.    RMR/MEDQ  D:  02/28/2002  T:   02/28/2002  Job:  [id number]   cc:   Kingsley Callander. Ouida Sills, M.D.  [address]  [place]  [place] [address]  Fax: [phone]

## 2010-02-18 NOTE — Letter (Signed)
Summary: LABS FROM DR Ouida Sills  LABS FROM DR Ouida Sills   Imported By: Rexene Alberts 09/17/2009 15:49:39  _____________________________________________________________________  External Attachment:    Type:   Image     Comment:   External Document

## 2010-02-18 NOTE — Assessment & Plan Note (Signed)
Summary: fu ov with RMR per [facility] in 6-8 weeks,diarrhea,gerd,ibs,abd pai...   Visit Type:  Follow-up Visit Primary Care Marissa Lowrey:  Ouida Sills  Chief Complaint:  F/U diarrhea/gerd.  History of Present Illness: Several month history of diarrhea now in contrast to a many year history of constipation predominant IBS. Patient is now diabetic. Complete set of stool studies came back negative recently ;dicyclomine and  Hyomax have not helped.  Reflux symptoms well controlled on Prevacid once daily.  Current Medications (verified): 1)  Paroxetine Hcl 10 Mg  Tabs (Paroxetine Hcl) .... Take 1 Tablet By Mouth Once A Day 2)  Amlodipine Besylate 5 Mg Tabs (Amlodipine Besylate) .... Take 1 Tablet By Mouth Once A Day 3)  Estrace 0.5 Mg Tabs (Estradiol) .... Take 1 Tablet By Mouth Once A Day 4)  Metformin Hcl 1000 Mg Tabs (Metformin Hcl) .... Take 1 Tablet By Mouth Two Times A Day 5)  Alprazolam 0.5 Mg Tabs (Alprazolam) .... Take 1 Tablet By Mouth Two Times A Day 6)  Haloperidol 2 Mg Tabs (Haloperidol) .... Take 1 Tab By Mouth At Bedtime 7)  Lithium Carbonate 300 Mg Caps (Lithium Carbonate) .... One Tablet in The Am and Two Tablets At Bedtime 8)  Benztropine Mesylate 0.5 Mg Tabs (Benztropine Mesylate) .... Take 1 Tab By Mouth At Bedtime 9)  Hyomax-Sr 0.375 Mg Xr12h-Tab (Hyoscyamine Sulfate) .... One By Mouth 1-2 Times Daily. Hold For Constipation. As Needed 10)  Dicyclomine Hcl 10 Mg Caps (Dicyclomine Hcl) .... One By Mouth Up To Three Times A Day As Needed For Cramps/diarrhea. Take Before Meal. 11)  Amaryl 1 Mg Tabs (Glimepiride) .... One Tablet in The Am 12)  Prevacid .... Once Daily  Allergies (verified): 1)  ! Buspar 2)  ! Celebrex 3)  ! * Oxycodone 4)  ! Vasotec 5)  ! Vioxx 6)  ! * Predisone 7)  ! * Hctz 8)  ! Keflex  Past History:  Past Medical History: Last updated: 09/05/2009 arthritis diabetic High Blood Pressure manic depressive bipolar djd disk problems ptsd Recent URI, early  spring, two rounds of ABX Insomnia EGD/TCS, 6/09, Dr. Dellie Catholic ERE, single antral erosions, left-sided trv tics, pedunculated polyp in mid-desc colon (tub adenoma), next TCS 06/2012. IBS TCS, 2004, Dr. Rourk-->left-sided diverticular dz  Past Surgical History: Last updated: 09/05/2009 hysterectomy Cholecystectomy left lumpectomy abd hernia repair appy ?lysis of adhesions?  Family History: Last updated: 09/05/2009 Father, colon cancer in his 15s Brother, colon cancer in his 87s "everyone" has stomach issues No liver dz.  Social History: Last updated: 09/05/2009 No tob, alcohol, drug use. Two children. Divorced. 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 Worked at [employer] for period of time, but did not tolerate pace (due to psychiatric illnesses).  Vital Signs:  Patient profile:   60 year old female Height:      64 inches Weight:      265.50 pounds BMI:     45.74 Temp:     97.9 degrees F oral Pulse rate:   88 / minute BP sitting:   126 / 90  (left arm) Cuff size:   regular  Vitals Entered By: Cloria Spring LPN (October 03, 2009 10:57 AM)  Physical Exam  General:  alert conversant no acute distress Lungs:  clear to auscultation Heart:  regular rate rhythm without murmur gallop rub Abdomen:  obese. Positive bowel sounds, soft minimal epigastric tenderness; no obvious mass organomegaly Rectal:  deferred until the time of colonoscopy  Impression & Recommendations: Impression: 60 year old lady  with multiple comorbidities with lifelong constipation now with a several month history of apparent  refractory nonbloody diarrhea. History of colonic adenomas in the past. New diagnosis of diabetes. Stool studies negative. No improvement with antispasmodic therapy. Although this could be simply a swing in the direction of diarrhea predominant irritable bowel syndrome, we need to be concerned about other diagnostic possibilities including microscopic colitis, new-onset  inflammatory bowel disease and even celiac disease in this setting.  Recommendations: Proceed with a diagnostic ileocolonoscopy the near future. Risks, benefits, limitations and alternatives as well as imponderables have been reviewed. Questions have been answered; she is agreeable.  We'll go ahead and check a serum IgA level as well along with a celiac panel at this time.  Further recommendations to follow.  Other Orders: T-Celiac Disease Ab Evaluation (8002) T-igA (16109)  Appended Document: Orders Update    Clinical Lists Changes  Problems: Added new problem of PERSONAL HISTORY OF COLONIC POLYPS (ICD-V12.72) Orders: Added new Service order of Est. Patient Level IV (60454) - Signed

## 2010-02-18 NOTE — Progress Notes (Signed)
Summary: ? about DM medications  Phone Note Call from Patient Call back at Home Phone [phone]   Caller: Patient Summary of Call: pt called- left voicemail- she is having procedure done on 10/21/09. She wants to know if she needs to change any of her diabetic meds. Pt is on Medformin 1000mg  two times a day and amaryl 1mg  qam. please advise. Initial call taken by: Hendricks Limes LPN,  October 14, 2009 9:27 AM     Appended Document: ? about DM medications Day of prep, hold Amaryl. Take 1/2 normal dose of metformin (ie 500mg  two times a day).  Appended Document: ? about DM medications pt aware  Appended Document: ? about DM medications Hold metformin dose night before procedure and all DM meds morning of procedure  Appended Document: OV IN 4-6 WKS W/EXT PER RMR/SS REMINDER FOR 6 MONTH F/U IS IN THE COMPUTER

## 2010-02-18 NOTE — Letter (Signed)
Summary: TCS ORDER  TCS ORDER   Imported By: Ave Filter 10/03/2009 11:35:08  _____________________________________________________________________  External Attachment:    Type:   Image     Comment:   External Document

## 2010-02-20 NOTE — Assessment & Plan Note (Signed)
Summary: OV IN 4-6 WKS W/EXT PER RMR/SS   Visit Type:  Follow-up Visit Primary Care Provider:  Ouida Sills  Chief Complaint:  F/U procedures.  History of Present Illness: Here for FU diarrhea, IBS & colonoscopy.  Hx DM.  c/o diarrhea about 5 stools/day post-prandially, takes dicyclomine two times a day with good results.  Denies any abd pain.  Wt down 6-7# since 8/11.  c/o left knee pain.  Hx GERD on prevacid 30mg  daily does well.  Appetite somewhat decreased lately, but doesn't interfere w/ her meals.  c/o confusion recently.  Check BS in AM /PM avg. 130.    Current Medications (verified): 1)  Paroxetine Hcl 10 Mg  Tabs (Paroxetine Hcl) .... Take 1 Tablet By Mouth Once A Day 2)  Amlodipine Besylate 5 Mg Tabs (Amlodipine Besylate) .... Take 1 Tablet By Mouth Once A Day 3)  Estrace 0.5 Mg Tabs (Estradiol) .... Take 1 Tablet By Mouth Once A Day 4)  Metformin Hcl 1000 Mg Tabs (Metformin Hcl) .... Take 1 Tablet By Mouth Two Times A Day 5)  Alprazolam 0.5 Mg Tabs (Alprazolam) .... Take 1 Tablet By Mouth Two Times A Day 6)  Haloperidol 2 Mg Tabs (Haloperidol) .... Take 1 Tab By Mouth At Bedtime 7)  Lithium Carbonate 300 Mg Caps (Lithium Carbonate) .... One Tablet in The Am and Two Tablets At Bedtime 8)  Benztropine Mesylate 0.5 Mg Tabs (Benztropine Mesylate) .... Take 1 Tab By Mouth At Bedtime 9)  Hyomax-Sr 0.375 Mg Xr12h-Tab (Hyoscyamine Sulfate) .... One By Mouth 1-2 Times Daily. Hold For Constipation. As Needed 10)  Dicyclomine Hcl 10 Mg Caps (Dicyclomine Hcl) .... One By Mouth Up To Three Times A Day As Needed For Cramps/diarrhea. Take Before Meal. 11)  Amaryl 1 Mg Tabs (Glimepiride) .... One Tablet in The Am 12)  Prevacid .... Once Daily  Allergies (verified): 1)  ! Buspar 2)  ! Celebrex 3)  ! * Oxycodone 4)  ! Vasotec 5)  ! Vioxx 6)  ! * Predisone 7)  ! * Hctz 8)  ! Keflex  Past History:  Past Medical History: arthritis diabetic High Blood Pressure manic depressive bipolar djd  disk problems ptsd Recent URI, early spring, two rounds of ABX Insomnia TCS 10/21/09->Int hemorrhoids, anal papilla, Left-sided diverticulosis, tubular adenoma splenic flexure, benign bx's EGD/TCS, 6/09, Dr. Dellie Catholic ERE, single antral erosions, left-sided trv tics, pedunculated polyp in mid-desc colon (tub adenoma), next TCS 06/2012. IBS TCS, 2004, Dr. Rourk-->left-sided diverticular dz  Past Surgical History: Reviewed history from 09/05/2009 and no changes required. hysterectomy Cholecystectomy left lumpectomy abd hernia repair appy ?lysis of adhesions?  Review of Systems      See HPI General:  Complains of chills, fatigue, weakness, malaise, and sleep disorder; denies fever, sweats, and weight loss. CV:  Denies chest pains, angina, palpitations, syncope, dyspnea on exertion, orthopnea, PND, peripheral edema, and claudication. Resp:  Denies dyspnea at rest, dyspnea with exercise, cough, sputum, wheezing, coughing up blood, and pleurisy. GI:  See HPI; Denies difficulty swallowing, pain on swallowing, vomiting, vomiting blood, jaundice, and fecal incontinence. GU:  Denies urinary burning, blood in urine, nocturnal urination, urinary frequency, urinary incontinence, and abnormal vaginal bleeding. MS:  Complains of joint pain / LOM, joint swelling, and joint stiffness; denies joint deformity, low back pain, muscle weakness, muscle cramps, muscle atrophy, leg pain at night, leg pain with exertion, and shoulder pain / LOM hand / wrist pain (CTS); knee pain. Derm:  Denies rash, itching, dry skin, hives, moles, warts,  and unhealing ulcers. Psych:  Denies depression, anxiety, memory loss, suicidal ideation, hallucinations, paranoia, phobia, and confusion. Heme:  Denies bruising, bleeding, and enlarged lymph nodes.  Vital Signs:  Patient profile:   60 year old female Height:      64 inches Weight:      263.50 pounds BMI:     45.39 Temp:     98.4 degrees F oral Pulse rate:   88 /  minute BP sitting:   130 / 84  (left arm) Cuff size:   large  Vitals Entered By: Cloria Spring LPN (December 02, 2009 1:27 PM)  Physical Exam  General:  obese.  Well developed, no acute distress. Head:  Normocephalic and atraumatic. Eyes:  Sclera clear no icterus. Mouth:  No deformity or lesions, dentition normal. Neck:  Supple; no masses or thyromegaly. Heart:  Regular rate and rhythm; no murmurs, rubs,  or bruits. Abdomen:  Obese, Soft, nontender and nondistended. No masses, hepatosplenomegaly or hernias noted. Normal bowel sounds.without guarding and without rebound.   Msk:  Symmetrical with no gross deformities. Normal posture. Pulses:  Normal pulses noted. Extremities:  No clubbing, cyanosis, edema or deformities noted. Neurologic:  Alert and  oriented x4;  grossly normal neurologically. Skin:  Intact without significant lesions or rashes. Cervical Nodes:  No significant cervical adenopathy. Psych:  Alert and cooperative. Normal mood and affect.  Impression & Recommendations:  Problem # 1:  IRRITABLE BOWEL SYNDROME (ICD-564.1) Doing well w/ as needed dicyclomine since colonoscopy.  Orders: Est. Patient Level III (04540)  Problem # 2:  GERD (ICD-530.81) Well controlled on Prevacid 30mg  daily.  Problem # 3:  COLONIC POLYPS, ADENOMATOUS, HX OF (ICD-V12.72) Assessment: Comment Only  Patient Instructions: 1)  Continue dicyclomine as needed 2)  Continue prevacid daily 3)  Office visit 6 months w/ Dr Jena Gauss   Appended Document: OV IN 4-6 WKS W/EXT PER RMR/SS REMINDER FOR 6 MONTH F/U IS IN THE COMPUTER

## 2010-02-25 ENCOUNTER — Encounter (INDEPENDENT_AMBULATORY_CARE_PROVIDER_SITE_OTHER): Payer: Self-pay | Admitting: Psychiatry

## 2010-02-25 DIAGNOSIS — F3189 Other bipolar disorder: Secondary | ICD-10-CM

## 2010-03-07 ENCOUNTER — Emergency Department (HOSPITAL_COMMUNITY): Payer: Self-pay

## 2010-03-07 ENCOUNTER — Emergency Department (HOSPITAL_COMMUNITY)
Admission: EM | Admit: 2010-03-07 | Discharge: 2010-03-07 | Disposition: A | Discharge status: Home / Self Care | Payer: Self-pay | Attending: Emergency Medicine | Admitting: Emergency Medicine

## 2010-03-07 DIAGNOSIS — M949 Disorder of cartilage, unspecified: Secondary | ICD-10-CM | POA: Insufficient documentation

## 2010-03-07 DIAGNOSIS — M899 Disorder of bone, unspecified: Secondary | ICD-10-CM | POA: Insufficient documentation

## 2010-03-07 DIAGNOSIS — E119 Type 2 diabetes mellitus without complications: Secondary | ICD-10-CM | POA: Insufficient documentation

## 2010-03-07 DIAGNOSIS — Z9889 Other specified postprocedural states: Secondary | ICD-10-CM | POA: Insufficient documentation

## 2010-03-07 DIAGNOSIS — M545 Low back pain, unspecified: Secondary | ICD-10-CM | POA: Insufficient documentation

## 2010-03-07 DIAGNOSIS — Z79899 Other long term (current) drug therapy: Secondary | ICD-10-CM | POA: Insufficient documentation

## 2010-03-07 DIAGNOSIS — I10 Essential (primary) hypertension: Secondary | ICD-10-CM | POA: Insufficient documentation

## 2010-03-07 LAB — URINALYSIS, ROUTINE W REFLEX MICROSCOPIC
Hgb urine dipstick: NEGATIVE
Protein, ur: NEGATIVE mg/dL
Specific Gravity, Urine: 1.029 (ref 1.005–1.030)
Urine Glucose, Fasting: NEGATIVE mg/dL
pH: 5.5 (ref 5.0–8.0)

## 2010-03-31 LAB — BASIC METABOLIC PANEL
Chloride: 107 mEq/L (ref 96–112)
GFR calc Af Amer: >60 mL/min (ref >60)
GFR calc non Af Amer: >60 mL/min (ref >60)
Potassium: 3.9 mEq/L (ref 3.5–5.1)
Sodium: 141 mEq/L (ref 135–145)

## 2010-03-31 LAB — GLUCOSE, CAPILLARY: Glucose-Capillary: 277 mg/dL — ABNORMAL HIGH (ref 70–99)

## 2010-04-02 LAB — GLUCOSE, CAPILLARY: Glucose-Capillary: 168 mg/dL — ABNORMAL HIGH (ref 70–99)

## 2010-04-02 LAB — STOOL CULTURE

## 2010-04-02 LAB — FECAL LACTOFERRIN, QUANT: Fecal Lactoferrin: NEGATIVE

## 2010-04-02 LAB — CLOSTRIDIUM DIFFICILE EIA

## 2010-04-02 LAB — OVA AND PARASITE EXAMINATION: Ova and parasites: NONE SEEN

## 2010-04-22 ENCOUNTER — Emergency Department (HOSPITAL_COMMUNITY): Payer: Self-pay

## 2010-04-22 ENCOUNTER — Inpatient Hospital Stay (HOSPITAL_COMMUNITY)
Admission: RE | Admit: 2010-04-22 | Discharge: 2010-04-24 | DRG: 897 | Disposition: A | Discharge status: Other Acute Inpatient Hospital | Payer: Self-pay | Source: Ambulatory Visit | Attending: Internal Medicine | Admitting: Internal Medicine

## 2010-04-22 DIAGNOSIS — E119 Type 2 diabetes mellitus without complications: Secondary | ICD-10-CM | POA: Diagnosis present

## 2010-04-22 DIAGNOSIS — I10 Essential (primary) hypertension: Secondary | ICD-10-CM | POA: Diagnosis present

## 2010-04-22 DIAGNOSIS — E860 Dehydration: Secondary | ICD-10-CM | POA: Diagnosis present

## 2010-04-22 DIAGNOSIS — F319 Bipolar disorder, unspecified: Secondary | ICD-10-CM | POA: Diagnosis present

## 2010-04-22 DIAGNOSIS — T438X5A Adverse effect of other psychotropic drugs, initial encounter: Secondary | ICD-10-CM | POA: Diagnosis present

## 2010-04-22 DIAGNOSIS — E876 Hypokalemia: Secondary | ICD-10-CM | POA: Diagnosis present

## 2010-04-22 DIAGNOSIS — F19921 Other psychoactive substance use, unspecified with intoxication with delirium: Principal | ICD-10-CM | POA: Diagnosis present

## 2010-04-22 LAB — BASIC METABOLIC PANEL
BUN: 14 mg/dL (ref 6–23)
CO2: 27 mEq/L (ref 19–32)
Calcium: 9.3 mg/dL (ref 8.4–10.5)
Chloride: 105 mEq/L (ref 96–112)
Creatinine, Ser: 1.51 mg/dL — ABNORMAL HIGH (ref 0.4–1.2)

## 2010-04-22 LAB — DIFFERENTIAL
Basophils Absolute: 0 10*3/uL (ref 0.0–0.1)
Basophils Relative: 0 % (ref 0–1)
Eosinophils Absolute: 0.2 10*3/uL (ref 0.0–0.7)
Eosinophils Relative: 2 % (ref 0–5)
Monocytes Absolute: 0.7 10*3/uL (ref 0.1–1.0)

## 2010-04-22 LAB — RAPID URINE DRUG SCREEN, HOSP PERFORMED
Amphetamines: POSITIVE — AB
Barbiturates: NOT DETECTED
Tetrahydrocannabinol: NOT DETECTED

## 2010-04-22 LAB — URINALYSIS, ROUTINE W REFLEX MICROSCOPIC
Nitrite: NEGATIVE
Specific Gravity, Urine: 1.025 (ref 1.005–1.030)
Urobilinogen, UA: 0.2 mg/dL (ref 0.0–1.0)

## 2010-04-22 LAB — CBC
HCT: 39.7 % (ref 36.0–46.0)
MCHC: 31.7 g/dL (ref 30.0–36.0)
RDW: 13.6 % (ref 11.5–15.5)

## 2010-04-22 LAB — LITHIUM LEVEL: Lithium Lvl: 2.28 mEq/L (ref 0.80–1.40)

## 2010-04-22 LAB — URINE MICROSCOPIC-ADD ON

## 2010-04-22 LAB — ETHANOL: Alcohol, Ethyl (B): <5 mg/dL (ref 0–10)

## 2010-04-23 LAB — GLUCOSE, CAPILLARY
Glucose-Capillary: 124 mg/dL — ABNORMAL HIGH (ref 70–99)
Glucose-Capillary: 87 mg/dL (ref 70–99)

## 2010-04-23 LAB — SALICYLATE LEVEL: Salicylate Lvl: <4 mg/dL (ref 2.8–20.0)

## 2010-04-24 LAB — GLUCOSE, CAPILLARY
Glucose-Capillary: 112 mg/dL — ABNORMAL HIGH (ref 70–99)
Glucose-Capillary: 110 mg/dL — ABNORMAL HIGH (ref 70–99)
Glucose-Capillary: 136 mg/dL — ABNORMAL HIGH (ref 70–99)
Glucose-Capillary: 142 mg/dL — ABNORMAL HIGH (ref 70–99)

## 2010-04-24 LAB — COMPREHENSIVE METABOLIC PANEL
ALT: 22 U/L (ref 0–35)
AST: 20 U/L (ref 0–37)
Albumin: 3.7 g/dL (ref 3.5–5.2)
BUN: 7 mg/dL (ref 6–23)
Glucose, Bld: 119 mg/dL — ABNORMAL HIGH (ref 70–99)
Potassium: 4.1 mEq/L (ref 3.5–5.1)

## 2010-04-25 ENCOUNTER — Inpatient Hospital Stay (HOSPITAL_COMMUNITY)
Admission: AD | Admit: 2010-04-25 | Discharge: 2010-05-23 | DRG: 897 | Disposition: A | Discharge status: Home / Self Care | Payer: Medicaid Other | Source: Ambulatory Visit | Attending: Psychiatry | Admitting: Psychiatry

## 2010-04-25 DIAGNOSIS — D510 Vitamin B12 deficiency anemia due to intrinsic factor deficiency: Secondary | ICD-10-CM

## 2010-04-25 DIAGNOSIS — N289 Disorder of kidney and ureter, unspecified: Secondary | ICD-10-CM

## 2010-04-25 DIAGNOSIS — E876 Hypokalemia: Secondary | ICD-10-CM

## 2010-04-25 DIAGNOSIS — M129 Arthropathy, unspecified: Secondary | ICD-10-CM

## 2010-04-25 DIAGNOSIS — E860 Dehydration: Secondary | ICD-10-CM

## 2010-04-25 DIAGNOSIS — E669 Obesity, unspecified: Secondary | ICD-10-CM

## 2010-04-25 DIAGNOSIS — I10 Essential (primary) hypertension: Secondary | ICD-10-CM

## 2010-04-25 DIAGNOSIS — E119 Type 2 diabetes mellitus without complications: Secondary | ICD-10-CM

## 2010-04-25 DIAGNOSIS — T438X5A Adverse effect of other psychotropic drugs, initial encounter: Secondary | ICD-10-CM

## 2010-04-25 DIAGNOSIS — F0280 Dementia in other diseases classified elsewhere without behavioral disturbance: Secondary | ICD-10-CM

## 2010-04-25 DIAGNOSIS — F29 Unspecified psychosis not due to a substance or known physiological condition: Secondary | ICD-10-CM

## 2010-04-25 DIAGNOSIS — K5730 Diverticulosis of large intestine without perforation or abscess without bleeding: Secondary | ICD-10-CM

## 2010-04-25 DIAGNOSIS — F319 Bipolar disorder, unspecified: Secondary | ICD-10-CM

## 2010-04-25 DIAGNOSIS — K219 Gastro-esophageal reflux disease without esophagitis: Secondary | ICD-10-CM

## 2010-04-25 DIAGNOSIS — F19921 Other psychoactive substance use, unspecified with intoxication with delirium: Principal | ICD-10-CM

## 2010-04-25 DIAGNOSIS — K589 Irritable bowel syndrome without diarrhea: Secondary | ICD-10-CM

## 2010-04-25 LAB — LITHIUM LEVEL: Lithium Lvl: 0.67 mEq/L — ABNORMAL LOW (ref 0.80–1.40)

## 2010-04-25 LAB — GLUCOSE, CAPILLARY: Glucose-Capillary: 158 mg/dL — ABNORMAL HIGH (ref 70–99)

## 2010-04-26 LAB — GLUCOSE, CAPILLARY
Glucose-Capillary: 116 mg/dL — ABNORMAL HIGH (ref 70–99)
Glucose-Capillary: 113 mg/dL — ABNORMAL HIGH (ref 70–99)
Glucose-Capillary: 163 mg/dL — ABNORMAL HIGH (ref 70–99)

## 2010-04-26 LAB — BASIC METABOLIC PANEL
CO2: 25 mEq/L (ref 19–32)
Glucose, Bld: 134 mg/dL — ABNORMAL HIGH (ref 70–99)
Potassium: 3.4 mEq/L — ABNORMAL LOW (ref 3.5–5.1)
Sodium: 144 mEq/L (ref 135–145)

## 2010-04-26 LAB — FOLATE: Folate: 7.8 ng/mL

## 2010-04-27 LAB — GLUCOSE, CAPILLARY
Glucose-Capillary: 157 mg/dL — ABNORMAL HIGH (ref 70–99)
Glucose-Capillary: 123 mg/dL — ABNORMAL HIGH (ref 70–99)
Glucose-Capillary: 172 mg/dL — ABNORMAL HIGH (ref 70–99)
Glucose-Capillary: 184 mg/dL — ABNORMAL HIGH (ref 70–99)

## 2010-04-27 NOTE — Discharge Summary (Signed)
  [name], [name]               ACCOUNT NO.:  [id number]  MEDICAL RECORD NO.:  [id number]           PATIENT TYPE:  I  LOCATION:  A308                          FACILITY:  [facility]  PHYSICIAN:  Kingsley Callander. Ouida Sills, MD       DATE OF BIRTH:  21-Jul-1950  DATE OF ADMISSION:  04/22/2010 DATE OF DISCHARGE:  04/05/2012LH                         DISCHARGE SUMMARY-REFERRING   DISCHARGE DIAGNOSES: 1. Lithium toxicity. 2. Bipolar disorder. 3. Type 2 diabetes. 4. Hypertension. 5. Dehydration. 6. Hypokalemia.  PROCEDURES:  Head CT.  HOSPITAL COURSE:  This patient is a 60 year old female who presented with confusion, lethargy, and garbled speech.  She had recently been discharged from [facility] after treatment for lithium toxicity. She was evaluated in the emergency room and found to have an elevated lithium level again at 2.28.  She was somewhat dehydrated with a rise in BUN and creatinine to 14 and 1.51.  She was hemodynamically stable.  She was hospitalized in the telemetry setting.  Her lithium level dropped the next day to 1.73 and then the next day to 1.07.  She had a salicylate level of less than 4 and an acetaminophen level of less than 10.  She had been cared for by her sister since she had been discharged from [facility].  The ACT team and Psychiatry were consulted.  Arrangements have tentatively been made for transfer to a psychiatric facility at discharge.  Her speech remains somewhat garbled, and she remains confused.  Lithium has not been restarted.  She had evidently recently also been treated with Haldol by Dr. Lolly Mustache.  She had not been restarted on Haldol at the time of her discharge from [facility].  Her IV fluids are being discontinued.  Her BUN and creatinine have improved to 7 and 0.83.  She has a normal CBC.  Her CT scan of the brain revealed no acute abnormality.  She had recently had an MRI at [facility], which also revealed no acute abnormality.  DISCHARGE  MEDICATIONS: 1. Amlodipine 5 mg daily. 2. Alprazolam 0.5 mg b.i.d. p.r.n. 3. Ranitidine 150 mg b.i.d.  Metformin and glimepiride are being held at this point.  Lithium is also being held at this point.  Estradiol has been discontinued.  She is on 1800-calorie ADA diet.  Her glucose on the morning of discharge is 130.  Her condition at discharge is significantly improved.     Kingsley Callander. Ouida Sills, MD     ROF/MEDQ  D:  04/24/2010  T:  04/24/2010  Job:  [id number]  Electronically Signed by Carylon Perches MD on 04/27/2010 09:34:53 AM

## 2010-04-27 NOTE — H&P (Signed)
NAMETERESE, HEIER               ACCOUNT NO.:  [id number]  MEDICAL RECORD NO.:  [id number]           PATIENT TYPE:  I  LOCATION:  0503                          FACILITY:  [facility]  PHYSICIAN:  Marlis Edelson, DO        DATE OF BIRTH:  [birth year]  DATE OF ADMISSION:  04/25/2010 DATE OF DISCHARGE:                      PSYCHIATRIC ADMISSION ASSESSMENT   CHIEF COMPLAINT:  Not expressed.  HISTORY OF THE CHIEF COMPLAINT:  Shirley [name] is a 60 year old Caucasian female admitted to the [facility] on the evening of April 24, 2010.  She had been transferred from hospital within the [facility].  Per the history on the current medical record, she is a 60 year old woman who was brought to the ER somewhat somnolent on April 22, 2010.  She had supposedly had a long and complicated medical history and is followed by Lamont Dowdy, her attending physician.  She has a history of bipolar disorder and was on Risperdal in the past was then put on lithium about 2 years ago.  Since then, she has been admitted to [facility] with lithium toxicity and was felt have lithium toxicity upon her presentation.  The patient was evaluated with an admission impression of lethargy, probably secondary to lithium toxicity, lithium toxicity, bipolar disorder, obesity, benign essential hypertension, diverticulosis, a family history of colon cancer, reflux esophagitis, type 2 diabetes, and hypokalemia.  It was also noted on her discharge summary that she was dehydrated.  The discharge diagnosis from the hospital indicated lithium toxicity with the above-noted disorders.  They had related that she was found to have a lithium level 2.28.  She had an elevated BUN at 14 and a creatinine at 1.51.  She was hemodynamically stable.  She was hospitalized in the telemetry unit.  Her lithium level dropped the next day to 1.73 and the next day to 1.07.  She had a salicylate level that was less than 4 and  acetaminophen level less than 10.  It was reported that she had been cared for by her sister since she left a [facility] for lithium toxicity, and patient arrangements were for the patient to be discharged to a psychiatric facility, thus her transfer to this setting.  DISCHARGE MEDICATIONS:  Her discharge were outlined as: 1. Amlodipine 5 mg daily. 2. Xanax 0.5 mg twice per day p.r.n. 3. Ranitidine 150 mg twice per day.  Upon presentation today, Shirley [name] is very confused.  She has a presentation consistent with delirium.  When I asked her where she is at, she stated, "I am at Shirley [name]."  When I asked why, she states, "I don't know why."  She then stated that she has problems with her grandmother; "she passed away."  She is confused, and she lacked orientation to place including city, does not know the current president, does not know the time.  The patient, due to her current delirium and confusion, is unable to provide an adequate history.  PAST PSYCHIATRIC HISTORY:  Per the chart.  The patient has a history of bipolar disorder.  It is unspecified as to the type of bipolar disorder. Further psychiatric  history could not be obtained from the patient at this time.  PAST MEDICAL HISTORY:  Per records:  Diabetes mellitus type 2, hypertension, diverticulosis, gastroesophageal reflux disease, and obesity.  Further medical record is available on the computer record.  ALLERGIES:  VASOTEC, CEPHALOSPORINS, OXYCODONE, THIAZIDE DIURETICS, PREDNISONE, MORPHINE SULFATE.  MEDICATIONS AT THE TIME OF TRANSFER:  Are as noted in the above narrative.  SOCIAL HISTORY:  It is reported that she is divorced.  Currently being cared for by her sister.  Lives in [place], [place].  The patient can give no further information at this time.  SUBSTANCE USE HISTORY:  Unknown at this time.  FAMILY HISTORY:  Unknown at this time.  PSYCHIATRIC ASSESSMENT:  The patient appeared  disheveled.  She was wearing 2 back-to-back hospital gowns.  Eye contact was minimal.  Her motor behavior showed restlessness.  Her speech was slightly slurred. Level of consciousness was confused.  Her mood was not expressed.  Her affect was markedly blunted.  Thought process was disorganized.  She denied current perceptual symptoms and did not appear to be responding to internal stimuli.  Her judgment was impaired.  Insight was absent. She was cognitively not oriented to the place, not oriented to city, not oriented to time.  She does not know current events, does not know the current president.  CT of the head was obtained of April 22, 2010.  Impression was a stable noncontrast CT appearance of the brain. Laboratory results today including TSH show a TSH level of 3.948 UIU/mL.  Blood lithium level today is decreased to 0.67 mEq/L.  Comprehensive metabolic profile on April 24, 2010, showed a BUN of 7, creatinine of 0.83.  Glucose was 119 remaining.  Electrolytes were unremarkable.  Alkaline phosphatase was 128 units per liter with remaining values within normal limits.  CBC obtained on April 3 was unremarkable.  IMPRESSION:  AXIS I:  Delirium, likely secondary to recent lithium toxicity, bipolar disorder subtype not specified. AXIS II:  Deferred. AXIS III:  Per stated past medical history above. AXIS IV:  Severe. AXIS V:  10.  TREATMENT PLAN:  The patient is currently on the psychiatric unit where she has been placed on one-on-one for safety.  I will also place her and do-not-admit room with close observation.  We will follow with repeat BMET tomorrow morning.  Her lithium level has dropped appropriately, and I do not recommend, given what appears to be at least 2 episodes of recent lithium toxicity, reestablishing that medication.  In addition, the Xanax will be discontinued, particularly given her current mental status.  We will also discontinue ranitidine for possible  negative effects on the brain.  She will be started on Protonix and will continue amlodipine to address her hypertension and gastroesophageal reflux disease.  She has been placed on a sliding-scale insulin to help cover her diabetes mellitus. Laboratory and mental status examinations will need to be repeated. Once she is more alert, stable, and further history can be obtained, then a new psychiatric regimen will need to be explored and initiated. The patient will need to be monitored closely for any medical complications, particularly given her current state of delirium.          ______________________________ Marlis Edelson, DO     DB/MEDQ  D:  04/25/2010  T:  04/25/2010  Job:  [id number]  Electronically Signed by Marlis Edelson MD on 04/27/2010 09:38:27 PM

## 2010-04-27 NOTE — Progress Notes (Signed)
  NAMEJOELLA, SAEFONG               ACCOUNT NO.:  [id number]  MEDICAL RECORD NO.:  [id number]           PATIENT TYPE:  LOCATION:                                 FACILITY:  PHYSICIAN:  Kingsley Callander. Ouida Sills, MD       DATE OF BIRTH:  Dec 14, 1950  DATE OF PROCEDURE:  04/23/2010 DATE OF DISCHARGE:                                PROGRESS NOTE   Ms. Aughenbaugh was admitted last night with lithium toxicity.  She had previously been admitted with this last month at [facility].  Her lithium level on admission at this time is 2.28.  Her dose had evidently been reduced by 25% at discharge last time.  Her level this morning has decreased to 1.73.  She continues to exhibit garbled speech and confusion.  PHYSICAL EXAMINATION:  VITAL SIGNS:  Temperature 97.6, pulse 75, respirations 16, blood pressure 142/81. GENERAL:  She is alert. LUNGS:  Clear. HEART:  Regular with no murmurs. ABDOMEN:  Nontender with no hepatosplenomegaly. EXTREMITIES:  No edema.  IMPRESSION/PLAN: 1. Lithium toxicity.  Lithium will be held.  A telepsych consult will     be obtained.  She will likely require psychiatric consultation for     modification of her medication regimen following normalization of     her lithium level. 2. Hypokalemia.  She is being supplemented with potassium orally after     her admission potassium was 3.2. 3. Diabetes.  Fasting glucose this morning is 113.  Amaryl and     metformin will be held and sliding scale NovoLog will be used as     needed. 4. Dehydration.  Her BUN and creatinine on admission were 14 and 1.51.     She is on normal saline at 150 mL an hour.  Her metabolic profile     will be repeated tomorrow.  5.  Bipolar disorder. 5. Hypertension.  Continue amlodipine.     Kingsley Callander. Ouida Sills, MD     ROF/MEDQ  D:  04/23/2010  T:  04/23/2010  Job:  [id number]  Electronically Signed by Carylon Perches MD on 04/27/2010 09:34:58 AM

## 2010-04-28 LAB — GLUCOSE, CAPILLARY: Glucose-Capillary: 141 mg/dL — ABNORMAL HIGH (ref 70–99)

## 2010-04-28 NOTE — H&P (Signed)
Shirley [name], Shirley [name]               ACCOUNT NO.:  [id number]  MEDICAL RECORD NO.:  [id number]           PATIENT TYPE:  LOCATION:                                 FACILITY:  PHYSICIAN:  Mila Homer. Sudie Bailey, M.D.DATE OF BIRTH:  05-16-1950  DATE OF ADMISSION: DATE OF DISCHARGE:  LH                             HISTORY & PHYSICAL   This 60 year old woman was brought to the ER somewhat somnolent.  She has a long and complicated medical history.  She is followed by Dr. Carylon Perches, her LMD.  She has had bipolar disease for some time.  She was on Risperdal in the past, but then put on lithium as well about 2 years ago.  Since then, by history from the emergency room doctor, she was admitted to [facility] with lithium toxicity and may have the same today on this admission.  She has a positive family history for colon cancer in 2 first-degree relatives and has had a colonoscopy, which has shown diverticulosis. She has also GERD, benign essential hypertension, and type 2 diabetes.  CURRENT MEDICATIONS: 1. Lithium carbonate 300 mg. 2. Metformin 1000 mg b.i.d. 3. Estradiol 0.5 mg daily. 4. Amlodipine 5 mg daily. 5. Glimepiride 1 mg daily. 6. Alprazolam b.i.d. 7. Zantac 150 mg b.i.d.  ALLERGIES:  She gives a history of allergies to hydrochlorothiazide, Keflex, Lexapro, morphine, oxycodone, prednisone, and Vasotec.  In the ER, she is somewhat somnolent.  Her sentence structure is intact, but she is somewhat vague and has trouble talking and her speech is somewhat slowed.  PHYSICAL EXAMINATION:  When she arrived in the emergency room at 1 p.m. today, temperature is 98.6, blood pressure 115/75, pulse 76, respiratory rate 18.  Her O2 sat was 96%. GENERAL:  On my exam, she is pleasant.  She is no acute distress.  She is somewhat obese.  Her color is good. LYMPHATICS:  She has no axillary or supraclavicular adenopathy.  There is no anterior cervical adenopathy. LUNGS:  Appear clear  throughout, moving air well. HEART:  Regular rhythm, rate of about 80. ABDOMEN:  Soft and obese without organomegaly or mass. EXTREMITIES:  There is no edema of the ankles.  LABORATORY DATA:  Her admission blood work included a white cell count of 10,500, of which 71% are neutrophils, hemoglobin 12.6, platelet count 271,000, and a BMP which showed potassium 3.2, glucose 124, BUN 14, creatinine 1.51.  Her alcohol level was less than 5.  UA showed a moderate bilirubin and 15 ketones, 30 protein, and under the microscope she had hyaline casts.  She had 0 to 2 WBCs per HPF.  Her lithium level was 2.28 (be normal 0.80 to 1.40 for therapeutic level).  ADMISSION DIAGNOSES: 1. Lethargy, probably secondary to lithium toxicity. 2. Lithium toxicity. 3. Bipolar disease with mania. 4. Obesity. 5. Benign essential hypertension. 6. Diverticulosis. 7. Family history of colon cancer. 8. Reflux esophagitis. 9. Type 2 diabetes. 10.Hypokalemia.  The ER physicians talked to Poison Control.  The plan will be to keep her in the hospital at least overnight on IV fluids and recheck her lithium level in the morning.  We  will continue her on the metformin, amlodipine, glimepiride, alprazolam, and Zantac as above and use KCl 20 mEq b.i.d.  Will recheck a lithium level and a BMP in the morning.     Mila Homer. Sudie Bailey, M.D.     SDK/MEDQ  D:  04/22/2010  T:  04/23/2010  Job:  [id number]  Electronically Signed by John Giovanni M.D. on 04/28/2010 04:54:09 AM

## 2010-04-29 LAB — GLUCOSE, CAPILLARY
Glucose-Capillary: 141 mg/dL — ABNORMAL HIGH (ref 70–99)
Glucose-Capillary: 166 mg/dL — ABNORMAL HIGH (ref 70–99)

## 2010-04-29 LAB — BASIC METABOLIC PANEL
BUN: 12 mg/dL (ref 6–23)
Creatinine, Ser: 0.9 mg/dL (ref 0.4–1.2)
GFR calc non Af Amer: >60 mL/min (ref >60)
Glucose, Bld: 156 mg/dL — ABNORMAL HIGH (ref 70–99)

## 2010-04-30 LAB — GLUCOSE, CAPILLARY
Glucose-Capillary: 165 mg/dL — ABNORMAL HIGH (ref 70–99)
Glucose-Capillary: 126 mg/dL — ABNORMAL HIGH (ref 70–99)

## 2010-05-01 LAB — GLUCOSE, CAPILLARY
Glucose-Capillary: 171 mg/dL — ABNORMAL HIGH (ref 70–99)
Glucose-Capillary: 282 mg/dL — ABNORMAL HIGH (ref 70–99)

## 2010-05-02 LAB — GLUCOSE, CAPILLARY
Glucose-Capillary: 215 mg/dL — ABNORMAL HIGH (ref 70–99)
Glucose-Capillary: 166 mg/dL — ABNORMAL HIGH (ref 70–99)

## 2010-05-03 LAB — GLUCOSE, CAPILLARY
Glucose-Capillary: 124 mg/dL — ABNORMAL HIGH (ref 70–99)
Glucose-Capillary: 131 mg/dL — ABNORMAL HIGH (ref 70–99)

## 2010-05-04 LAB — GLUCOSE, CAPILLARY
Glucose-Capillary: 157 mg/dL — ABNORMAL HIGH (ref 70–99)
Glucose-Capillary: 226 mg/dL — ABNORMAL HIGH (ref 70–99)

## 2010-05-05 LAB — CBC
HCT: 37.1 % (ref 36.0–46.0)
MCH: 29.8 pg (ref 26.0–34.0)
MCHC: 32.1 g/dL (ref 30.0–36.0)
MCV: 93 fL (ref 78.0–100.0)
Platelets: 171 10*3/uL (ref 150–400)
RDW: 13.6 % (ref 11.5–15.5)

## 2010-05-05 LAB — COMPREHENSIVE METABOLIC PANEL
Alkaline Phosphatase: 94 U/L (ref 39–117)
BUN: 18 mg/dL (ref 6–23)
CO2: 27 mEq/L (ref 19–32)
Chloride: 100 mEq/L (ref 96–112)
Glucose, Bld: 176 mg/dL — ABNORMAL HIGH (ref 70–99)
Potassium: 3.1 mEq/L — ABNORMAL LOW (ref 3.5–5.1)
Total Bilirubin: 0.5 mg/dL (ref 0.3–1.2)

## 2010-05-05 LAB — DIFFERENTIAL
Eosinophils Absolute: 0 10*3/uL (ref 0.0–0.7)
Eosinophils Relative: 0 % (ref 0–5)
Lymphocytes Relative: 17 % (ref 12–46)
Lymphs Abs: 1.7 10*3/uL (ref 0.7–4.0)
Monocytes Absolute: 1.1 10*3/uL — ABNORMAL HIGH (ref 0.1–1.0)
Monocytes Relative: 11 % (ref 3–12)

## 2010-05-05 LAB — GLUCOSE, CAPILLARY: Glucose-Capillary: 226 mg/dL — ABNORMAL HIGH (ref 70–99)

## 2010-05-05 LAB — BRAIN NATRIURETIC PEPTIDE: Pro B Natriuretic peptide (BNP): <30 pg/mL (ref 0.0–100.0)

## 2010-05-06 LAB — GLUCOSE, CAPILLARY: Glucose-Capillary: 165 mg/dL — ABNORMAL HIGH (ref 70–99)

## 2010-05-06 NOTE — Consult Note (Signed)
[name], [name]               ACCOUNT NO.:  [id number]  MEDICAL RECORD NO.:  [id number]           PATIENT TYPE:  I  LOCATION:  0401                          FACILITY:  BH  PHYSICIAN:  Lonia Blood, M.D.DATE OF BIRTH:  [birth year]  DATE OF CONSULTATION:  04/25/2010 DATE OF DISCHARGE:                                CONSULTATION   REQUESTING PHYSICIAN:  Shamsher Ahluwalia, M. D.  PRIMARY CARE PHYSICIAN:  Kingsley Callander. Ouida Sills, M. D.  REASON FOR CONSULTATION:  Altered mental status.  HISTORY OF PRESENT ILLNESS:  Shirley [name] is a 60 year old female with a complex medical history as detailed below.  It appears that she has been suffering with alterations in her mental status dating back to mid to late March.  She apparently was evaluated during her previous hospital stay and [facility].  At that time she had an MRI. There was concern at that point that the patient's altered mental status/delirium was most likely secondary to lithium toxicity.  She was apparently sent home and then later readmitted to [facility] for altered mental status/confusion.  Again she was felt to be lithium toxic.  After the patient's lithium level corrected with IV fluid and holding of the medication she was transferred to [facility] for ongoing psychiatric evaluation.  She has a history of bipolar disorder.  During the patient's stay at [facility] she apparently has shown a general overall decline in her mental status.  She has become more and more lethargic and confused. Over the last 48 hours she has become quite agitated as well.  She is at times lashing and against the staff.  She has also proven to be quite anxious.  The triad hospitalist were therefore consulted to assure there is no medical basis for the patient's altered mental status.  I was escorted to the day room to evaluate the patient along with a Behavioral Health nurse.  The  patient was resting comfortably on a couch.  She was quite agitated with my presence and would not answer directed specific questions.  She denied any complaints whatsoever.  She was rude with the nurse and lashed out and her, at times verbally.  I am not able that to obtain any further history.  REVIEW OF SYSTEMS:  As comprehensive review of systems cannot be accomplished as the patient is confused and not cooperative.  PAST MEDICAL HISTORY:  (Per old records). 1. Bipolar disorder. 2. Diabetes mellitus type 2. 3. Hypertension. 4. History of lithium toxicity with prior admissions for the same. 5. Diverticulosis. 6. Status post hysterectomy. 7. Status post cholecystectomy. 8. Status post appendectomy. 9. Status post left breast lumpectomy. 10.Status post hernia repair. 11.Chronic right lower quadrant pelvic pain. 12.Irritable bowel syndrome.  CURRENT MEDICATIONS: 1. Norvasc 5 mg daily. 2. Klonopin 1 mg b.i.d.. 3. Vitamin B12 1000 mcg p.o. daily. 4. Protonix 40 mg daily.  ALLERGIES:  (Per old records) HCTZ, Keflex, oxycodone, prednisone, Vasotec, Lexapro, morphine.  FAMILY HISTORY:  There is significant family history of colon cancer.  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  She does not smoke.  She does not drink.  She is apparently married.  DATA REVIEWED:  Hemoglobin is low at 11.9, MCV of 93, platelet count and white count normal.  Sodium is normal.  Potassium is low at 3.1. Chloride, bicarb BUN, creatinine are normal.  Serum glucose is elevated at 176.  Calcium is normal.  LFTs are normal.  Albumin is 2.8.  BNP is less than 30.  TSH is 3.9.  B12 level is noted to be 175.  CT scan of the head on April 22, 2010 reveals no acute disease.  An MRI of the head dated late March at [facility] is also unrevealing.  PHYSICAL EXAMINATION:  VITAL SIGNS:  Temperature 98.8, blood pressure 144/91, heart rate 20, respiratory rate  19, O2 SAT 100% on room air. GENERAL:  Well-developed, well-nourished, overweight female in no acute respiratory distress who is agitated and unable to provide a reliable history. HEENT:  Normocephalic, atraumatic.  Pupils are equal, round reactive light and accommodation.  Extraocular muscles appear to be intact.  Oral cavity is remarkable for dry mucous membranes but otherwise unrevealing. NECK:  No JVD or lymphadenopathy. LUNGS:  Clear to auscultation bilaterally without wheezes or rhonchi through anterior fields. CARDIOVASCULAR:  Regular rate and rhythm without murmur, gallop or rub. Normal S1-S2. ABDOMEN:  Overweight, soft.  Bowel sounds present.  No organomegaly.  No rebound.  No ascites. EXTREMITIES:  Trace bilateral lower extremity edema, no cyanosis or clubbing. NEUROLOGICAL:  The patient is alert.  She will answer some questions but avoids others.  She is clearly confused.  She moves all four extremities spontaneously.  It is difficult to judge her strength as she is not felt to be fully cooperate with exam but she does appear to be diffusely weak at a 4+/5 level.  She is not hyperreflexic and there are no focal neurologic signs to suggest a central brain lesion.  IMPRESSION AND PLAN: 1. Altered mental status.  Certainly a psychiatric component could be     at play here.  The primary team is addressing these issues.  There     has been some been some concern about possible benzo chronic in the     outpatient setting.  To address this, Klonopin has been added.  I     would agree with this move.  Additionally, I am quite concerned     about the patient's B12 level.  Her B12 is impressively low at 175.     I do note that she is on oral B12.  Unfortunately, it is quite     likely that she suffered with pernicious anemia.  Additionally, she     is on a proton pump inhibitor which would further inhibit her     ability to absorb oral B12.  A severe B12 deficiency could, in      fact, be the etiology of her symptoms.  I will initiate a B12 load     using subcutaneous B12.  She will receive 1000 mcg daily for 7     days.  We will then step back to once a week for a month, and then     to every 2 weeks for a month, and then to once a month.  We will     follow-up on the patient's clinical status with in 48-72 hours.  We     would not expect to see a significant change if this is B12  deficiency until a minimum of 3-4 days have passed.  I do not see     evidence of a localizing neurologic issue to suggest that further     neuro imaging would be indicated at the present time. 2. Diabetes mellitus.  At the present time the patient's diabetes     appears to be poorly controlled.  Nonetheless, I do not see any     evidence of hypoglycemia.  At the present time that would be our     biggest concern.  I suggest to continue the sliding scale insulin     as you are administering now.  Our primary goal at this point     should be to assure that she does not suffer with hypoglycemia.  As     the patient's mental status improves, we can then make further     adjustments to her chronic diabetic regimen as indicated. 3. Moderate hypokalemia.  The patient is suffering with a significant     hypokalemia.  This is likely due to poor p.o. intake.  The patient     has been written for 40 mEq dose today.  That should be sufficient.     I would recommend rechecking the patient's potassium level in     approximately 48 hours. 4. Hypertension.  The patient's blood pressure is not ideally     controlled at the present time.  Nonetheless, much like her     diabetes, I would not expect it to be very well controlled until     her mental status can be stabilized.  As long as she is able to     avoid the extremes of blood pressure, then we will be happy as we     are for now.  Thank you very much for your consultation.  We will be happy to continue to follow along in this interesting  scenario in an attempt to assist you in improving this Shirley [name]' general medical condition.     Lonia Blood, M.D.     JTM/MEDQ  D:  05/05/2010  T:  05/05/2010  Job:  [id number]  Electronically Signed by Jetty Duhamel M.D. on 05/06/2010 04:10:08 PM

## 2010-05-07 LAB — GLUCOSE, CAPILLARY
Glucose-Capillary: 145 mg/dL — ABNORMAL HIGH (ref 70–99)
Glucose-Capillary: 145 mg/dL — ABNORMAL HIGH (ref 70–99)

## 2010-05-07 LAB — BASIC METABOLIC PANEL
CO2: 30 mEq/L (ref 19–32)
Calcium: 9.7 mg/dL (ref 8.4–10.5)
Chloride: 104 mEq/L (ref 96–112)
GFR calc Af Amer: >60 mL/min (ref >60)
Sodium: 143 mEq/L (ref 135–145)

## 2010-05-08 LAB — GLUCOSE, CAPILLARY: Glucose-Capillary: 192 mg/dL — ABNORMAL HIGH (ref 70–99)

## 2010-05-09 LAB — GLUCOSE, CAPILLARY
Glucose-Capillary: 184 mg/dL — ABNORMAL HIGH (ref 70–99)
Glucose-Capillary: 163 mg/dL — ABNORMAL HIGH (ref 70–99)

## 2010-05-10 LAB — GLUCOSE, CAPILLARY: Glucose-Capillary: 137 mg/dL — ABNORMAL HIGH (ref 70–99)

## 2010-05-11 LAB — GLUCOSE, CAPILLARY
Glucose-Capillary: 164 mg/dL — ABNORMAL HIGH (ref 70–99)
Glucose-Capillary: 137 mg/dL — ABNORMAL HIGH (ref 70–99)
Glucose-Capillary: 129 mg/dL — ABNORMAL HIGH (ref 70–99)

## 2010-05-12 LAB — GLUCOSE, CAPILLARY
Glucose-Capillary: 163 mg/dL — ABNORMAL HIGH (ref 70–99)
Glucose-Capillary: 148 mg/dL — ABNORMAL HIGH (ref 70–99)
Glucose-Capillary: 156 mg/dL — ABNORMAL HIGH (ref 70–99)

## 2010-05-13 LAB — GLUCOSE, CAPILLARY: Glucose-Capillary: 144 mg/dL — ABNORMAL HIGH (ref 70–99)

## 2010-05-14 ENCOUNTER — Encounter (HOSPITAL_COMMUNITY): Payer: Medicaid Other | Admitting: Psychiatry

## 2010-05-14 LAB — GLUCOSE, CAPILLARY: Glucose-Capillary: 100 mg/dL — ABNORMAL HIGH (ref 70–99)

## 2010-05-15 LAB — GLUCOSE, CAPILLARY
Glucose-Capillary: 124 mg/dL — ABNORMAL HIGH (ref 70–99)
Glucose-Capillary: 125 mg/dL — ABNORMAL HIGH (ref 70–99)
Glucose-Capillary: 101 mg/dL — ABNORMAL HIGH (ref 70–99)

## 2010-05-16 LAB — GLUCOSE, CAPILLARY
Glucose-Capillary: 141 mg/dL — ABNORMAL HIGH (ref 70–99)
Glucose-Capillary: 117 mg/dL — ABNORMAL HIGH (ref 70–99)

## 2010-05-17 LAB — GLUCOSE, CAPILLARY
Glucose-Capillary: 116 mg/dL — ABNORMAL HIGH (ref 70–99)
Glucose-Capillary: 96 mg/dL (ref 70–99)

## 2010-05-18 LAB — GLUCOSE, CAPILLARY
Glucose-Capillary: 178 mg/dL — ABNORMAL HIGH (ref 70–99)
Glucose-Capillary: 117 mg/dL — ABNORMAL HIGH (ref 70–99)
Glucose-Capillary: 121 mg/dL — ABNORMAL HIGH (ref 70–99)

## 2010-05-19 LAB — GLUCOSE, CAPILLARY
Glucose-Capillary: 173 mg/dL — ABNORMAL HIGH (ref 70–99)
Glucose-Capillary: 112 mg/dL — ABNORMAL HIGH (ref 70–99)
Glucose-Capillary: 259 mg/dL — ABNORMAL HIGH (ref 70–99)

## 2010-05-20 DIAGNOSIS — T438X5A Adverse effect of other psychotropic drugs, initial encounter: Secondary | ICD-10-CM

## 2010-05-20 DIAGNOSIS — F19921 Other psychoactive substance use, unspecified with intoxication with delirium: Secondary | ICD-10-CM

## 2010-05-20 DIAGNOSIS — F319 Bipolar disorder, unspecified: Secondary | ICD-10-CM

## 2010-05-20 DIAGNOSIS — F29 Unspecified psychosis not due to a substance or known physiological condition: Secondary | ICD-10-CM

## 2010-05-20 LAB — GLUCOSE, CAPILLARY
Glucose-Capillary: 118 mg/dL — ABNORMAL HIGH (ref 70–99)
Glucose-Capillary: 131 mg/dL — ABNORMAL HIGH (ref 70–99)
Glucose-Capillary: 106 mg/dL — ABNORMAL HIGH (ref 70–99)

## 2010-05-21 LAB — GLUCOSE, CAPILLARY: Glucose-Capillary: 111 mg/dL — ABNORMAL HIGH (ref 70–99)

## 2010-05-22 ENCOUNTER — Encounter: Payer: Self-pay | Admitting: Internal Medicine

## 2010-05-22 LAB — GLUCOSE, CAPILLARY
Glucose-Capillary: 144 mg/dL — ABNORMAL HIGH (ref 70–99)
Glucose-Capillary: 137 mg/dL — ABNORMAL HIGH (ref 70–99)

## 2010-05-23 LAB — GLUCOSE, CAPILLARY

## 2010-05-27 NOTE — Discharge Summary (Signed)
NAMEZAAKIRAH, KISTNER               ACCOUNT NO.:  [id number]  MEDICAL RECORD NO.:  [id number]           PATIENT TYPE:  I  LOCATION:  0401                          FACILITY:  BH  PHYSICIAN:  Eulogio Ditch, MD DATE OF BIRTH:  25-Dec-1950  DATE OF ADMISSION:  04/25/2010 DATE OF DISCHARGE:  05/25/2010                              DISCHARGE SUMMARY   IDENTIFYING INFORMATION:  This is a 60 year old female, single.  This is a voluntary admission.  HISTORY OF PRESENT ILLNESS:  Quinesha is a 60 year old, Caucasian female with a history of bipolar disorder with psychosis who had been followed by Dr. Kathryne Sharper, her primary psychiatrist.  She was transferred to the Behavioral Health Adult Unit on April 25, 2010 from our Medical Unit at [facility] where she had been admitted on April 22, 2010 with a lithium level of 2.28.  She had recently been discharged from [facility] [place] for lithium toxicity and was evaluated again in the emergency room due to altered mental status that had concerned her sister since she had been discharged from [facility].  On the Medical Unit she was hemodynamically stable and somewhat dehydrated with a creatinine of 1.51.  After hydration and observation her lithium level dropped to 1.73 and the following day to 1.07.  Other tox screens were negative.  Given her previous psychiatric history she was transferred to [facility] to evaluate her mood, thinking and determine a further course of treatment.  Upon initial presentation Ms. Lococo was found to be quite confused and with a presentation consistent with delirium, believing that she was in another hospital, could not explain why she was hospitalized and lacked orientation to place, time and another basic facts.  MEDICAL EVALUATION:  This is an obese Caucasian female who was medically evaluated at [facility]; please see the discharge summary dictated by Dr.  Carylon Perches.  Additional chronic medical conditions included diabetes mellitus, type 2, hypertension, diverticulosis, gastroesophageal reflux disease and obesity.  Previous CT of the head had been obtained on April 22, 2010 at [facility] and showed a stable noncontrast CT.  We later learned from Dr. Ouida Sills that she had also had an MRI at [facility] which was negative for any acute findings.  TSH normal at 3.948 and blood lithium level decreased to 0.67 on admission at [facility]. Comprehensive metabolic profile revealed a BUN of 7, creatinine of 0.83 and random glucose of 119.  Electrolytes were unremarkable and alkaline phosphatase 128 with remaining hepatic function normal.  COURSE OF HOSPITALIZATION:  She was admitted to our Acute Stabilization and Intensive Care Unit for close observation given her confusion.  She was given an initial working diagnosis of delirium, likely secondary to recent lithium toxicity with history of bipolar disorder with psychotic features.  We contacted her primary psychiatrist, Dr. Kathryne Sharper, who advised [place] that she had been on lithium for several years and results were consistently below therapeutic level or in the low therapeutic range and she had no previous history of lithium toxicity.  He had discharged her from his practice in early February 2012 after  she had missed multiple appointments.  She was initially admitted under the care of Dr. Marlis Edelson and transferred to the care of Dr. Rogers Blocker on April 29, 2010.  She was placed on one-to-one observation from April 25, 2010 through May 12, 2010 due to some nighttime agitation and her daytime confusion, for safety.  Throughout her stay she was calm, cooperative and directable.  We elected to start her on not Klonopin 1 mg b.i.d. and to restart her previous dose of metformin in a reduced dosage form, Protonix and she was started on amlodipine 5 mg daily.  The lithium was not restarted. Metformin and  glimepiride had initially been held due to her previous renal insufficiency and, as these continued to be normal, these medications were gradually reintroduced.  She tolerated them well.  She was placed on an 1800-calorie diet.  She was also initially placed on Risperdal 0.5 mg p.o. b.i.d. for mood stability and help with episodes of confusion.  She was restarted on alprazolam 0.5 mg p.o. b.i.d. Eventually, her alprazolam was discontinued and she was started on Klonopin which she tolerated well and which was titrated to 1 mg b.i.d. She had previously been on Risperdal but there were apparent concerns about this altering her blood sugar so we restarted her previous home medication of Haldol 2 mg q.a.m. and at bedtime in its place.  Seroquel was introduced XR 50 mg q.a.m. and two tablets at bedtime to assist in controlling some nighttime agitation, and she also tolerated this well.  Internal Medicine consultation was provided by Dr. Jetty Duhamel for her altered mental status.  He noted that she had been suffering with alterations in mental status dating back to mid to late March.  Her B12 level was found to be impressively low at 175 and she had a family history of pernicious anemia.  At his recommendation she was placed on a B12 subcutaneous loading regimen which continues at discharge.  Our case manager remained in contact with her sister who was an active participant in her care and supportive in finding placement.  By May 12, 2010, we were able to discontinue the one-to-one observation on [place].  For the next week and a half she continued to have some on nighttime awakening and wandering around two in the morning which eventually completely resolved.  She continues to have some pleasant confusion, often looking for her purse or various objects and is easily redirectable.  Gait is stable and Physical Therapy and Occupational Therapy worked with her for a couple of weeks and now  has signed off due to the fact that her gait is completely stable as are transfers without assistive devices.  By May 16, 2010, she was continuing to have some rambling speech but calm and cooperative, looking for clothing that she believes is missing and looking for her purse which is locked in her locker, frequently getting confused about family members that live in [place], believing a family member to be deceased who is actually alive. By May 20, 2010, she is fully participating in groups, calm and cooperative, needing some redirection, no dangerous ideas, no mood lability.  It was felt that she was safe to go on to continuing care and follow up as an outpatient.  Her sister was able to secure placement at [facility] in [facility] and our case manager coordinated discharge plans with them.  DISCHARGE DIAGNOSES:  AXIS I:  Delirium secondary to lithium toxicity, resolved, rule out dementia secondary  to pernicious anemia, history of bipolar disorder with psychosis. AXIS II:  No diagnosis. AXIS III:  Diabetes mellitus, type 2, stable.  Hypertension, stable. Obesity.  Pernicious anemia on B12 supplementation. AXIS IV:  Supportive family is an asset. AXIS V:  Current Global Assessment of Functioning 55, past year Global Assessment of Functioning 64, estimated.  DISCHARGE MEDICATIONS: 1. Clonazepam 1 mg b.i.d. 2. Cyanocobalamin 1000 mcg subcu every seven days next due on May 26, 2010, Jun 02, 2010 and Jun 09, 2010 and then every two weeks     starting on June 23, 2010 and July 07, 2010 and then once per month     starting on August 05, 2010. 3. Haldol 2 mg q.a.m. and at bedtime. 4. Metformin 500 mg, two tablets b.i.d. with meals. 5. Protonix 40 mg daily. 6. Quetiapine XR 50 mg q.a.m. and two tablets at bedtime. 7. Amlodipine 5 mg daily. 8. Estradiol one tablet daily. 9. Amaryl 1 mg q.a.m. with a.m. meal. 10.She is instructed to discontinue the alprazolam, the  lithium     carbonate and to not resume any previous blood pressure     medications.  FOLLOW-UP:  She has a follow-up appointment with Dr. Carylon Perches, her primary care physician, on Jun 05, 2010 at 11:45 a.m. and with Dr. Lolly Mustache, her primary care physician, on May 29, 2010 at 10:15 a.m.     Margaret A. Lorin Picket, N.P.   ______________________________ Eulogio Ditch, MD    MAS/MEDQ  D:  05/23/2010  T:  05/23/2010  Job:  [id number]  Electronically Signed by Kari Baars N.P. on 05/26/2010 10:12:57 AM Electronically Signed by Eulogio Ditch  on 05/27/2010 04:50:44 PM

## 2010-05-29 ENCOUNTER — Encounter (HOSPITAL_COMMUNITY): Payer: Medicaid Other | Admitting: Psychiatry

## 2010-06-03 NOTE — Group Therapy Note (Signed)
NAMESINDY, MCCUNE               ACCOUNT NO.:  [id number]   MEDICAL RECORD NO.:  [id number]           PATIENT TYPE:   LOCATION:                                 FACILITY:   PHYSICIAN:  Syed T. Arfeen, M.D.   DATE OF BIRTH:  1951-01-14                                 PROGRESS NOTE   TIME SPENT:  Fifteen to twenty minutes.   The patient came today for her follow-up appointment.  She had missed  the last appointment due to a recent visit to see her sister in  [place].  She described her visit was stressful, as she lost her  luggage in the beginning, and then she could not sleep due to the new  place.  She endorsed mood swings, agitation and anger.  Despite she is  taking her Risperdal and reported no side effects, she continues to have  racing thoughts and agitation.  We talked about adding a mood  stabilizer, and the patient remembered taking lithium in the past which  helped her. She was taking Lamictal, but due to a rash she stopped.  She  also recently had poison ivy while she was in [place] and needed  two courses of antibiotic.  She is recovering from poison ivy, but now  she is complaining of right ear pain.  She is scheduled to see ENT very  soon.  She denies any hallucinations, suicidal thoughts or homicidal  thoughts, but is still complaining of residual mood swings, agitation  and anger.   ASSESSMENT:  Bipolar disorder, recurrent, manic.  The patient still had residual  symptoms of bipolar illness.  We talked about adding a mood stabilizer,  with which she seems to agree.   PLAN:  I will add lithium 300 mg twice a day, along with her Risperdal 0.5 mg  at bedtime.  She remembered taking lithium a few years ago which helped  her in the past.  However, she needed to stop it because she was  complaining of a lot of sedation, but she was also taking almost 1500 mg  of lithium and trazodone.  She is not taking the trazodone at this time.  She has been given Xanax  0.5 mg b.i.d. by Dr. Ouida Sills and Paxil 10 mg  daily.  She asked Dr. Ouida Sills to increase her Xanax; however, Dr. Ouida Sills  refused to increase it to three times a day,  I will do the lithium  level in 10 days.  I explained the risks and benefits of the medication  in detail, which the patient acknowledged.  She did not endorse any side  effects or any EPS or involuntary movements.  I will see her in 3-4  weeks.      Syed T. Lolly Mustache, M.D.  Electronically Signed     STA/MEDQ  D:  08/16/2008  T:  08/16/2008  Job:  [id number]

## 2010-06-03 NOTE — H&P (Signed)
[name], [name]               ACCOUNT NO.:  [id number]   MEDICAL RECORD NO.:  [id number]          PATIENT TYPE:  AMB   LOCATION:  DAY                           FACILITY:  [facility]   PHYSICIAN:  R. Roetta Sessions, M.D. DATE OF BIRTH:  1950-03-18   DATE OF ADMISSION:  DATE OF DISCHARGE:  LH                              HISTORY & PHYSICAL   CHIEF COMPLAINT:  1. Positive family history of colon cancer of two first-degree      relatives.  2. History of diverticulosis, need for high risk screening      colonoscopy.  3. Left-sided abdominal pain.   HISTORY OF PRESENT ILLNESS:  [name] is a pleasant 60 year old  Caucasian female works as Diplomatic Services operational officer at [employer].  She  came to see me today for followup.  She had a long history of IBS-D over  the past couple years, has now become constipated.  She had a  colonoscopy back in 2004, by me for a larger colorectal cancer screen.  She had some left-sided diverticula.  She has positive family history in  her dad, who was diagnosed with disease in his 20s and a brother who was  diagnosed with colon cancer at age 57.  She has not passed any blood per  rectum.  She is having a difficult time having a bowel movement.  She  mainly have a couple of bowel movements weekly and has taken magnesium  citrate on occasion.  She has had some vague left-sided abdominal pain,  but more temporally related to constipation when she moves her bowels.  Those symptoms settle down.   She does have longstanding gastroesophageal reflux disease, describes  having an EGD several years ago, and is having reflux symptoms almost  daily.  She is not on any acid suppression therapy at this time.  She  does not have any odynophagia or dysphagia.  She has actually lost 4  pounds since I last saw her back in March 2006.  Prior EGD by Dr. Katrinka Blazing  reportedly was normal.  She has moved from [facility] to [facility] down in [place], and she tells me  it is a very fast paced  environment and it is causing her to get stressed out as she feels it  maybe making her physically sick.   PAST MEDICAL HISTORY:  1. Hypertension.  2. Bipolar anxiety.  3. Neurosis.   PAST SURGERIES:  1. Hysterectomy.  2. Cholecystectomy.  3. Appendectomy.  4. Left lumpectomy.  5. Hernia repair.   CURRENT MEDICATIONS:  1. Nabumetone 500 mg b.i.d.  2. Hydrocodone 5/500.  3. Paroxetine 10 mg daily.  4. Amlodipine 5 mg daily.  5. Estradiol 0.5 mg daily.  6. Alprazolam 0.5 mg b.i.d.  7. Advil p.r.n.Marland Kitchen   ALLERGIES:  PREDNISONE, OXYCODONE, VASOTEC, HYDROCHLOROTHIAZIDE, AND  KEFLEX.   FAMILY HISTORY:  Most significant as outlined above.  Mother had  Alzheimer's.   SOCIAL HISTORY:  The patient endorses she works for [employer].  No tobacco, no alcohol, and no illicit drugs.   REVIEW  OF SYSTEMS:  No chest pain or dyspnea on exertion.  No fever or  chills.  No odynophagia, no dysphagia, and no nausea or vomiting.   PHYSICAL EXAMINATION:  GENERAL:  Pleasant 57-year lady, resting  comfortably.  VITAL SIGNS:  Height 5 feet 4 inches, temperature 98.2, BP 130/90, and  pulse 80.  SKIN:  Warm and dry.  There is no jaundice.  HEENT:  No scleral icterus.  CHEST:  Lungs are clear to auscultation.  CARDIAC:  Regular rate and rhythm without murmur, gallop, or rub.  ABDOMEN:  Nondistended.  Positive bowel sounds.  Soft.  She has minimal  epigastric tenderness to palpation.  No obvious mass or organomegaly.  RECTAL:  Deferred at the time of colonoscopy.   IMPRESSION:  [name] is a pleasant 60 year old lady with a  positive family history of colon cancer in two first-degree relatives,  one at relatively young age.  She has a history of irritable bowel  syndrome, but has been constipated over the past couple of years, has  had some vague left-sided abdominal pain, thought to be related to  constipation.  She also has untreated  gastroesophageal reflux disease,  symptoms frequently on a daily basis without any alarm features.   RECOMMENDATIONS:  She needs a high risk screening colonoscopy in the  near future.  Risks, benefits, alternatives, and limitations have been  reviewed.  Questions answered and she is agreeable.  Since it has been a  long time since she has had an EGD and given her epigastric tenderness,  I have offered her an EGD at the same time as TCS.  Risks, benefits, and  alternatives again were reviewed and questions were answered, and she is  agreeable.  In the interim, we will go and start her on some Nexium 40  mg pill once daily, samples x30.  In the interim, I will make further  recommendations in the very near future.      Jonathon Bellows, M.D.  Electronically Signed     RMR/MEDQ  D:  06/23/2007  T:  06/24/2007  Job:  [id number]   cc:   Kingsley Callander. Ouida Sills, MD  Fax: [phone]

## 2010-06-03 NOTE — Assessment & Plan Note (Signed)
NAME:  Shirley [name], Shirley [name]                CHART#:  [id number]   DATE:  08/16/2007                       DOB:  1950-11-19   CHIEF COMPLAINT:  Followup of her procedures.   SUBJECTIVE:  The patient is here for a followup visit.  She recently  underwent EGD and colonoscopy on June 29, 2007.  She has a positive  family history of colon cancer in 2 first-degree relatives, history of  IBS with constipation predominant, and some recent refractory GERD like  symptoms.  At the time of EGD, she had tiny distal esophageal erosions,  a single antral erosion, otherwise normal stomach, and D1 and D2.  She  had normal rectum, left-sided transverse diverticula, pedunculated polyp  in the mid descending colon, which was a tubular adenoma.  She will have  a followup colonoscopy in 5 years.  She was advised to continue Nexium.  She states she actually stopped taking this and she felt good.  She only  took it for a couple of weeks after her procedure.  She was also asked  to take a fiber supplement daily and MiraLax 17 g as needed for  constipation.  She states now she is actually not having any more  constipation and is tending towards loose stools.  She does have some  abdominal cramping associated with this about 3 times a week.  She  states she has been very anxious about her job situation, upset about  her recent loss of employment at [employer] after working there  for 16 years.  She states this has got her very anxious and she tends to  have loose stools related to this.  She denies any heartburn, nausea, or  vomiting.  Otherwise, she feels well.   CURRENT MEDICATIONS:  List is incomplete, the patient has to bring back  her medications.   ALLERGIES:  VASOTEC, HYDROCHLOROTHIAZIDE, KEFLEX, PREDNISONE, AND  OXYCODONE.   PHYSICAL EXAMINATION:  VITAL SIGNS:  Weight 250 pounds, stable.  Temp  97.8, blood pressure 128/82, and pulse is 76.  GENERAL:  A pleasant, obese, Caucasian female in no acute  distress.  SKIN:  Warm and dry.  No jaundice.  HEENT:  Sclerae nonicteric.  Oropharyngeal mucosa moist and pink.  ABDOMEN:  Positive bowel sounds.  Obese, and soft.  She has minimal left  lower quadrant tenderness to deep palpation.  No rebound or guarding.  No organomegaly or masses.  LOWER EXTREMITIES:  No edema.   IMPRESSION:  The patient is a 60 year old lady with history of irritable  bowel syndrome, gastroesophageal reflux disease with mild erosive reflux  esophagitis on recent EGD.  She is doing reasonably well at this time.  Unfortunately, she did not complete her Nexium.  She is no longer having  any heartburn symptoms.  Her bowel movements have swung from  constipation back to diarrhea, which is usually her norm with her  irritable bowel syndrome.  I believe the stress from her recent job  changes and family issues is what is contributing to her irritable bowel  syndrome symptoms at this time.   PLAN:  1. Nexium 40 mg daily for another 4-6 weeks, samples provided.  2. Trial of HyoMax sublingual t.i.d. p.r.n. diarrhea and abdominal      pain, #90 with 1 refill.  3. She will have a colonoscopy  in 5 years with followup.  4. Office visit p.r.n.       Tana Coast, P.A.  Electronically Signed     R. Roetta Sessions, M.D.  Electronically Signed    LL/MEDQ  D:  08/16/2007  T:  08/16/2007  Job:  [id number]   cc:   Kingsley Callander. Ouida Sills, MD

## 2010-06-03 NOTE — Op Note (Signed)
NAMETASHAWN, Shirley [name]               ACCOUNT NO.:  [id number]   MEDICAL RECORD NO.:  [id number]          PATIENT TYPE:  AMB   LOCATION:  DAY                           FACILITY:  [facility]   PHYSICIAN:  R. Roetta Sessions, M.D. DATE OF BIRTH:  07-Jul-1950   DATE OF PROCEDURE:  06/29/2007  DATE OF DISCHARGE:                               OPERATIVE REPORT   INDICATIONS FOR PROCEDURE:  A 60 year old lady with positive family  history of colon cancer to first-degree relatives, history of irritable  bowel syndrome with constipation predominant recently vague left-sided  abdominal pain.  EGD and colonoscopy now being done.  She has had some  refractory gastroesophageal reflux disease symptoms, has not been on  regular acid suppression.  We will start her on some Nexium 40 mg orally  and through the office recently with significant improvement of reflux  symptoms.  She is not having odynophagia or dysphagia.  Risks, benefits,  alternatives, and limitations have been reviewed.  Questions answered,  all parties agreeable.  Please see documentation medical record.   PROCEDURE NOTE:  O2 saturation, blood pressure, pulse, respirations were  monitored throughout the entire procedure.  Conscious sedation, Versed 4  mg, IV Demerol 100 mg, IV divided doses, Phenergan 12.5 mg IV diluted  slow IV push to augment conscious sedation, Cetacaine spray for topical  pharyngeal anesthesia.   FINDINGS:  EGD examination tubular esophagus that revealed a couple of  tiny distal esophageal erosions.  There was no Barrett's esophagus or  other abnormality.  EGD junction easily traversed.  Stomach, gastric  cavity was emptied and insufflated well with air.  Thorough examination  of the gastric mucosa including retroflexion of the proximal stomach,  esophagogastric junction demonstrated solitary prepyloric antral  erosions, otherwise the gastric mucosa appeared entirely normal.  Pylorus was patent, easily traversed.   Examination of the bulb and  second portion revealed no abnormalities.   THERAPEUTIC/DIAGNOSTIC MANEUVERS PERFORMED:  None.   The patient tolerated the procedure well.  For colonoscopy, digital  rectal exam revealed no abnormalities.  Endoscopic findings.  Prep was  suboptimal, but doable.  Colon:  Colonic mucosa was surveyed from  rectosigmoid junction through the left transverse, right colon,  appendiceal orifice, ileocecal valve, and cecum.  These structures were  all seen and photographed for the record.  Terminal ileum was then made  5 cm from this level, the scope was slowly cautiously withdrawn.  All  previously mentioned mucosal surfaces were again seen.  The patient had  left-sided transverse diverticula.  There was a 1-cm pedunculated polyp  in the mid descending colon, which was removed with  the hot snare  cautery and recovered through the scope.  The remainder of colonic  mucosa and terminal ileal mucosa appeared normal.  The scope was pulled  down to the rectum where a thorough examination of the rectal mucosa  including retroflexed view of the anal verge demonstrated no  abnormalities.  The patient tolerated both procedures well and was  reactive in the endoscopy.   IMPRESSION:  EGD.  Tiny distal esophageal erosions consistent with mild  erosive reflux esophagitis, single antral erosions otherwise  unremarkable stomach, patent pylorus, normal D1 and D2.  Colonoscopy  findings, normal rectum, left-sided transverse diverticula, pedunculated  polyp, mid descending colon removed with the snare.  Remainder colonic  mucosa and terminal ileal mucosa appeared normal.   RECOMMENDATIONS:  1. Continue Nexium 40 g orally daily, would continue this for both      gastroesophageal reflux disease and site protection given that she      is on nonsteroidal agents and did have a single, likely NSAID      induced, gastric erosion.  2. Daily Metamucil or Citrucel fiber supplement.  3.  MiraLax 70 g orally at bedtime p.r.n. constipation.  4. Follow up on path.  5. Further recommendations to follow.      Jonathon Bellows, M.D.  Electronically Signed     RMR/MEDQ  D:  06/29/2007  T:  06/30/2007  Job:  [id number]   cc:   Kingsley Callander. Ouida Sills, MD  Fax: [phone]

## 2010-06-06 NOTE — H&P (Signed)
Shirley [name], Shirley [name]               ACCOUNT NO.:  [id number]   MEDICAL RECORD NO.:  [id number]          PATIENT TYPE:  AMB   LOCATION:  DAY                           FACILITY:  [facility]   PHYSICIAN:  Jerolyn Shin C. Katrinka Blazing, M.D.   DATE OF BIRTH:  1950-05-31   DATE OF ADMISSION:  DATE OF DISCHARGE:  LH                                HISTORY & PHYSICAL   HISTORY OF PRESENT ILLNESS:  This is a 60 year old female with history of  onset of recurrent pain in the right lower quadrant. The pain is  intermittent. She has episodes of nausea. She has been seen by GI. She had a  CT of the abdomen which was normal. Small bowel follow through was normal.  Colonoscopy was normal except for diverticulosis. Because of recurrent  symptoms the patient is scheduled for diagnostic laparoscopy and we will do  appendectomy because of the possibility or potential for chronic  appendicitis or chronic appendiceal fecalith.   PAST MEDICAL HISTORY:  1.  She has gastroesophageal reflux disease.  2.  Irritable bowel syndrome.  3.  Chronic breast pain.  4.  Chronic depression.  5.  Possible bipolar disorder.   REVIEW OF SYSTEMS:  Positive for chronic abdominal pain. Chronic depressive  illness and mood swings. Low back pain.   SOCIAL HISTORY:  She is employed, divorced. Mother of one. She does not  drink, smoke or use drugs.   PHYSICAL EXAMINATION:  VITAL SIGNS:  Blood pressure 146/90, pulse 80,  respirations 20. Weight 248 pounds.  HEENT:  Unremarkable.  NECK:  Supple, no JVD, bruit, adenopathy or thyromegaly.  CHEST:  Clear to auscultation.  HEART:  Regular rate and rhythm without murmur, rub, or gallop.  ABDOMEN:  Moderate to mild tenderness in the right lower quadrant. No  masses, no guarding.  EXTREMITIES:  No clubbing, cyanosis or edema.  NEUROLOGIC EXAM:  No focal motor, sensory or cerebellar deficits.   IMPRESSION:  1.  Chronic intermittent severe right lower quadrant pain.  2.  History of irritable bowel  syndrome.  3.  Chronic mastalgia, especially on the left side.  4.  Bipolar disorder.  5.  Hypertension.   PLAN:  The patient will have diagnostic laparoscopy, possible adhesiolysis  and probable appendectomy.      Dirk Dress. Katrinka Blazing, M.D.  Electronically Signed     LCS/MEDQ  D:  02/12/2005  T:  02/12/2005  Job:  [id number]

## 2010-06-06 NOTE — Discharge Summary (Signed)
Shirley [name], Shirley [name]               ACCOUNT NO.:  [id number]   MEDICAL RECORD NO.:  [id number]          PATIENT TYPE:  INP   LOCATION:  A224                          FACILITY:  [facility]   PHYSICIAN:  Dirk Dress. Katrinka Blazing, M.D.   DATE OF BIRTH:  October 24, 1950   DATE OF ADMISSION:  02/13/2005  DATE OF DISCHARGE:  01/28/2007LH                                 DISCHARGE SUMMARY   DISCHARGE DIAGNOSES:  1.  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  2.  Fecalith of the appendix probably contributing to her right lower      quadrant pain.  3.  Gastroesophageal reflux disease.  4.  Irritable bowel syndrome.  5.  Hypertension.  6.  Manic depressive disorder.   PROCEDURE:  Diagnostic pelvic laparoscopy with adhesiolysis and appendectomy  on January 26.   DISPOSITION:  The patient is discharged home in stable, satisfactory  condition.  She is much improved from the time of admission.   HISTORY OF PRESENT ILLNESS:  A 60 year old female with history of onset of  recurrent pain in the right lower quadrant.  Pain was intermittent.  She had  episodes of nausea.  She was seen by gastroenterology.  CT of abdomen was  normal.  Small-bowel follow-through was normal.  Colonoscopy was normal  except for diverticulosis.  Because of chronic recurrent symptoms, she was  scheduled for diagnostic laparoscopy with appendectomy because of the  possibility of chronic appendicitis and/or chronic appendiceal fecalith.   PAST MEDICAL HISTORY:  1.  Gastroesophageal reflux disease.  2.  Irritable bowel syndrome.  3.  Chronic breast pain.  4.  Chronic depression.  5.  Bipolar disorder.   PHYSICAL EXAMINATION:  GENERAL:  On examination, she was in no acute  distress.  ABDOMEN:  Abdomen revealed moderate tenderness in the right lower quadrant.   HOSPITAL COURSE:  The patient was admitted through day surgery and she  underwent pelvic laparoscopy where she was found to have dense adhesions of  the cecum, small  bowel and omentum.  Adhesiolysis was done.  The appendix  was involved so appendectomy was also done.  The patient's postoperative  course was unremarkable.  She  noted, in the postoperative period, however, that her right lower quadrant  pain had resolved.  She was advanced rapidly to a regular diet and tolerated  it well.  She had no nausea or vomiting and no fever and she was discharged  home on postop day #2 in satisfactory condition.      Dirk Dress. Katrinka Blazing, M.D.  Electronically Signed     LCS/MEDQ  D:  03/22/2005  T:  03/23/2005  Job:  [id number]

## 2010-06-06 NOTE — Op Note (Signed)
   NAME:  Shirley [name], Shirley [name]                         ACCOUNT NO.:  [id number]   MEDICAL RECORD NO.:  [id number]                   PATIENT TYPE:  AMB   LOCATION:  DAY                                  FACILITY:  [facility]   PHYSICIAN:  R. Roetta Sessions, M.D.              DATE OF BIRTH:  1950/03/08   DATE OF PROCEDURE:  02/28/2002  DATE OF DISCHARGE:                                 OPERATIVE REPORT   PROCEDURE:  Diagnostic/screening colonoscopy.   INDICATIONS FOR PROCEDURE:  The patient is a 60 year old lady with  alternating constipation and diarrhea, and marked family history of  colorectal cancer x4.  Colonoscopy has been discussed previously.  Potential  risks, benefits, and alternatives have been reviewed and questions answered.  Please see the documentation in the medical record.   PROCEDURE NOTE:  The O2 saturation, blood pressure, pulses, and respirations  were monitored throughout the entire procedure.  Conscious sedation:  Versed  5 mg IV, Demerol 100 mg IV, in divided doses.  Instrument:  Olympus video  tip adult colonoscope.  Findings:  Digital rectal exam revealed no  abnormalities.  Prep was good.  Rectal:  Examination of the rectal mucosa  including retroflexion at the anal verge revealed no abnormalities.  The  colonic mucosa was surveyed from the rectosigmoid junction, through the  left, transverse, and right colon, to the area of the appendiceal orifice,  ileocecal valve, and cecum.  These structures were well seen and  photographed for the record.  The patient was noted to have left-sided  diverticula.  The remainder of the colonic mucosa appeared normal from the  level of the cecum and the ileocecal valve.  The scope was slowly withdrawn.  All previously-mentioned mucosal surfaces were again seen.  No other  abnormalities were observed.  The patient tolerated the procedure well and  was reacted in endoscopy.   IMPRESSION:  1. Normal rectum.  2. Left-sided diverticula,  remainder of colonic mucosa appeared normal.  3. Suspect the patient has irritable bowel syndrome.   RECOMMENDATIONS:  1. Repeat colonoscopy in five years for high-risk screening purposes.  2.     Diverticulosis literature applied to the patient.  3. Reinstitute antispasmodic therapy to low dose; i.e. Levbid 1/2 tablet     p.o. each morning.  4. Follow up with me in the office in six weeks.                                               Jonathon Bellows, M.D.    RMR/MEDQ  D:  02/28/2002  T:  02/28/2002  Job:  [id number]   cc:   Kingsley Callander. Ouida Sills, M.D.  [address]  [place]  [place] [address]  Fax: [phone]

## 2010-06-06 NOTE — Procedures (Signed)
   NAME:  Shirley [name], Shirley [name]                         ACCOUNT NO.:  [id number]   MEDICAL RECORD NO.:  [id number]                   PATIENT TYPE:  EMS   LOCATION:  ED                                   FACILITY:  [facility]   PHYSICIAN:  Edward L. Juanetta Gosling, M.D.             DATE OF BIRTH:  09-09-1950   DATE OF PROCEDURE:  10/19/2001  DATE OF DISCHARGE:  10/19/2001                                EKG INTERPRETATION   The rhythm is a sinus rhythm with a rate in the 80s.  There are nonspecific  ST-T wave abnormalities seen anteriorly and laterally.  Minimally abnormal  electrocardiogram.                                               Edward L. Juanetta Gosling, M.D.    ELH/MEDQ  D:  10/20/2001  T:  10/24/2001  Job:  [id number]

## 2010-06-06 NOTE — Op Note (Signed)
[name], Shirley [name]               ACCOUNT NO.:  [id number]   MEDICAL RECORD NO.:  [id number]          PATIENT TYPE:  OBV   LOCATION:  A224                          FACILITY:  [facility]   PHYSICIAN:  Dirk Dress. Katrinka Blazing, M.D.   DATE OF BIRTH:  1950/05/08   DATE OF PROCEDURE:  02/13/2005  DATE OF DISCHARGE:                                 OPERATIVE REPORT   INDICATIONS FOR PROCEDURE:  Shirley is a 60 year old female with history of a  chronic recurrent episodic right lower quadrant pain associated with nausea.  She has had an extensive workup without abnormal findings. She has been  worked up by GI. Possibility of chronic appendicitis or an appendix with an  appendicolith has been entertained. The patient has been explored  laparoscopically for adhesiolysis and appendectomy to try to relieve her  sometimes disabling pain.   PREOPERATIVE DIAGNOSIS:  Chronic abdominal pain.   POSTOPERATIVE DIAGNOSIS:  Chronic right lower quadrant and pelvic adhesions.   PROCEDURE:  Pelvic laparoscopy with adhesiolysis and appendectomy.   SURGEON:  Dr. Katrinka Blazing.   DESCRIPTION:  Under general anesthesia, the patient's abdomen was prepped  and draped in a sterile field. Supraumbilical incision was made. Veress  needle was inserted uneventfully. Abdomen was insufflated with 4 liters of  CO2 2. Using a Visiport guide, the 10-mm port was placed uneventfully.  Laparoscope was placed. Dense adhesions between the omentum, colon and  abdominal wall were encountered. Under videoscopic guidance, a 5-mm port was  placed in the suprapubic position, and a 12-mm port was placed in the left  lower quadrant. The patient was placed in deep Trendelenburg position.  Initial adhesions to the anterior abdominal wall were taken down using  Endoshears with electrocautery. Adhesions of the small bowel to the omentum  were freed using basically Endoshears. The cecum was freed of adhesions.  Dissection was continued down into the pelvis.  Adhesions between the  rectosigmoid and the right lateral pelvic side wall were taken down without  difficulty. The appendix was identified. It was dissected. Mesoappendix was  clipped with multiple clips and divided. The base of the appendix was  transected using an Endo GIA 30 with standard staples. The appendix was  retrieved. Irrigation was carried out. The patient tolerated the procedure  well. The fluid returned clear. Inspection of the areas of dissection  revealed no evidence of bleeding, no evidence of visceral injury. CO2 was  allowed to escape from the abdomen, and ports were  removed. The incisions were closed using 0 Vicryl at the umbilicus and  staples on the skin. The patient tolerated the procedure well. Sterile  dressings were placed. She was awakened from anesthesia uneventfully,  transferred to a bed, and taken to post-anesthesia care unit for monitoring.      Dirk Dress. Katrinka Blazing, M.D.  Electronically Signed     LCS/MEDQ  D:  02/13/2005  T:  02/13/2005  Job:  [id number]   cc:   Kingsley Callander. Ouida Sills, MD  Fax: [phone]

## 2010-06-06 NOTE — Op Note (Signed)
Shirley [name], MORDECAI               ACCOUNT NO.:  [id number]   MEDICAL RECORD NO.:  [id number]          PATIENT TYPE:  AMB   LOCATION:  DSC                          FACILITY:  [facility]   PHYSICIAN:  Rodney A. Mortenson, M.D.DATE OF BIRTH:  [birth year]   DATE OF PROCEDURE:  08/28/2004  DATE OF DISCHARGE:                                 OPERATIVE REPORT   PREOPERATIVE DIAGNOSIS:  Osteoarthritis, right knee, lateral subluxation and  tracking, right patella, with osteoarthritis, lateral patellar facet.   POSTOPERATIVE DIAGNOSES:  1.  Lateral subluxation, right patella, with osteoarthritis, lateral      patellar facet.  2.  Osteophytic ridge, lateral femoral condyle.  3.  Area of osteoarthritis of the anterior aspect of the lateral femoral      condyle.  4.  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   OPERATION:  1.  Arthroscopic evaluation of the knee.  2.  Arthroscopic debridement, medial femoral condyle, medial patellar facet.  3.  Open lateral release.  4.  Partial patellectomy with lateral facetectomy, right patella.  5.  Debride osteophytic ridge superior and lateral side, lateral femoral      condyle, right knee.   SURGEON:  Lenard Galloway. Chaney Malling, M.D.   ANESTHESIA:  General.   PATHOLOGY:  With the arthroscope in the knee, a very careful examination of  the knee was undertaken.  The patellofemoral junction was visualized first.  There was definitely lateral subluxation of the patella and the lateral  patellar facet showed no articular cartilage.  Over the anterior aspect of  the lateral femoral condyle, there was an area of total loss of articular  cartilage.  There was a large ridge of osteophytes about the superior  portion of the lateral femoral condyle at the junction of the articular and  non-articular surfaces.  This osteophytic ridge extended laterally and  halfway down the lateral femoral condyle at the junction of the articular  and non-articular  surfaces.  The medial patellar facet appeared fairly  normal.  There was some fraying of articular cartilage in this area and some  fraying of articular cartilage over the medial femoral condyle.  The femoral  notch appeared fairly normal.  With the arthroscope in the intercondylar  notch, the ACL was normal.  The arthroscope was then placed in the medial  compartment and the articular cartilage on the medial femoral weightbearing  area, medial tibial plateau and the entire circumference of the medial  meniscus was normal.  In the lateral compartment, the lateral meniscus was  normal, as was the lateral plateau.  There was a small cartilage defect over  the weightbearing area of the lateral femoral condyle.   PROCEDURE:  Patient placed on the operating table in supine position,  pneumatic tourniquet about the right upper thigh.  The right lower extremity  then prepped and draped in the usual manner.  The leg was then wrapped out  with an Esmarch and the tourniquet was elevated.  An infusion cannula was  placed in the superior medial pouch and the knee distended with saline.  Anterior medial and anterior  lateral portals made and the arthroscope was  introduced.  The findings were described as above.  The arthroscopic shaver  was introduced.  The medial patellar facet was debrided with the  chondroplastic shaver and there was some fraying of articular cartilage over  the anterior aspect of the medial femoral condyle, and this was also  debrided.  The knee was tracked and there was definite lateral subluxation  of the patella.  It was felt that an open lateral release and partial  facetectomy was necessary.  The arthroscope was then removed.   An incision made over the lateral side of the patella.  Bleeders were  coagulated.  A lateral release was done.  The patella was then tilted about  6 degrees on its side and excellent access was achieved.  There was a large  area of total loss of  articular cartilage over the lateral patellar facet  and a ridge of osteophytes about the lateral side of the patella.  Using a  power saw, a lateral facetectomy was done.  There was also a large ridge of  osteophytes about the superior and lateral side of the lateral femoral  condyle, and the large rongeur was inserted.  The ridge of osteophytes was  then removed.  Bone wax was used to cover the raw bony surfaces.  The knee  was irrigated with a copious amount of saline solution and vacuumed out.  The knee was then put through a full range of motion.  The patella now  tracked nicely in the midline and the patella did not contact the area of  articular cartilage loss over the anterior aspect of the lateral femoral  condyle.  It was felt that a medial plication was not necessary.  Good range  of motion of the knee.  The subcutaneous tissue was closed with 2-0 Vicryl  and the skin was closed with stainless steel staples.  Marcaine was placed  in the knee and a large bulky pressure dressing applied and the patient then  returned to the recovery room in excellent condition.  Technically this  procedure went extremely well.  I was very pleased with the surgical  procedure.   FOLLOW-UP CARE:  1.  To my office on Wednesday.  2.  Percocet for pain.       RAM/MEDQ  D:  08/28/2004  T:  08/28/2004  Job:  [id number]
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
## 2010-10-08 ENCOUNTER — Other Ambulatory Visit (HOSPITAL_COMMUNITY): Payer: Self-pay | Admitting: Nurse Practitioner

## 2010-10-08 DIAGNOSIS — Z139 Encounter for screening, unspecified: Secondary | ICD-10-CM

## 2010-12-15 ENCOUNTER — Ambulatory Visit (HOSPITAL_COMMUNITY): Payer: Medicaid Other

## 2012-06-16 ENCOUNTER — Encounter: Payer: Self-pay | Admitting: Internal Medicine

## 2014-12-29 ENCOUNTER — Encounter (HOSPITAL_COMMUNITY): Payer: Self-pay | Admitting: Emergency Medicine

## 2014-12-29 ENCOUNTER — Emergency Department (HOSPITAL_COMMUNITY)
Admission: EM | Admit: 2014-12-29 | Discharge: 2014-12-29 | Disposition: A | Discharge status: Home / Self Care | Payer: Medicare (Managed Care) | Attending: Emergency Medicine | Admitting: Emergency Medicine

## 2014-12-29 ENCOUNTER — Emergency Department (HOSPITAL_COMMUNITY): Payer: Medicare (Managed Care)

## 2014-12-29 DIAGNOSIS — F0280 Dementia in other diseases classified elsewhere without behavioral disturbance: Secondary | ICD-10-CM | POA: Insufficient documentation

## 2014-12-29 DIAGNOSIS — X58XXXA Exposure to other specified factors, initial encounter: Secondary | ICD-10-CM | POA: Insufficient documentation

## 2014-12-29 DIAGNOSIS — Y9301 Activity, walking, marching and hiking: Secondary | ICD-10-CM | POA: Diagnosis not present

## 2014-12-29 DIAGNOSIS — S8991XA Unspecified injury of right lower leg, initial encounter: Secondary | ICD-10-CM | POA: Diagnosis not present

## 2014-12-29 DIAGNOSIS — Y92009 Unspecified place in unspecified non-institutional (private) residence as the place of occurrence of the external cause: Secondary | ICD-10-CM | POA: Diagnosis not present

## 2014-12-29 DIAGNOSIS — M25561 Pain in right knee: Secondary | ICD-10-CM

## 2014-12-29 DIAGNOSIS — Z8742 Personal history of other diseases of the female genital tract: Secondary | ICD-10-CM | POA: Insufficient documentation

## 2014-12-29 DIAGNOSIS — Y998 Other external cause status: Secondary | ICD-10-CM | POA: Diagnosis not present

## 2014-12-29 DIAGNOSIS — Z96651 Presence of right artificial knee joint: Secondary | ICD-10-CM | POA: Insufficient documentation

## 2014-12-29 DIAGNOSIS — G309 Alzheimer's disease, unspecified: Secondary | ICD-10-CM | POA: Insufficient documentation

## 2014-12-29 HISTORY — DX: Disorder of kidney and ureter, unspecified: N28.9

## 2014-12-29 HISTORY — DX: Alzheimer's disease, unspecified: G30.9

## 2014-12-29 HISTORY — DX: Dementia in other diseases classified elsewhere, unspecified severity, without behavioral disturbance, psychotic disturbance, mood disturbance, and anxiety: F02.80

## 2014-12-29 NOTE — ED Notes (Signed)
Pt presents to ED for evaluation of right knee pain after a trip and fall today.  Pt denies LOC.  Pt had a knee replacement more than 10 years ago on the same side.  NAD at this time.

## 2014-12-29 NOTE — ED Notes (Signed)
PA at bedside updating patient.

## 2014-12-29 NOTE — ED Notes (Signed)
Pt able to ambulate independently 

## 2014-12-29 NOTE — ED Provider Notes (Signed)
CSN: [id number]     Arrival date & time 12/29/14  1741 History  By signing my name below, I, Shirley [name], attest that this documentation has been prepared under the direction and in the presence of Waynetta Pean, PA-C. Electronically Signed: Rayna [name], ED Scribe. 12/29/2014. 6:14 PM.     Chief Complaint  Patient presents with  . Knee Pain   The history is provided by the patient. No language interpreter was used.    HPI Comments: Shirley [name] is a 64 y.o. female who presents to the Emergency Department complaining of constant, moderate, right knee pain with onset earlier today. Pt was walking down the steps at her home and felt a sudden onset of her current symptoms after feeling a pop in her right knee. She denies any knee pain when sitting but notes it greatly worsens when bearing weight. Pt denies a fall or any head trauma occurred. She notes a SHx to her right knee many years ago noting that it was probably a total knee replacement but is unsure. She denies numbness, tingling, weakness, fever and chills.   Past Medical History  Diagnosis Date  . Alzheimer's dementia   . Renal disorder    Past Surgical History  Procedure Laterality Date  . Replacement total knee     History reviewed. No pertinent family history. Social History  Substance Use Topics  . Smoking status: Never Smoker   . Smokeless tobacco: None  . Alcohol Use: No   OB History    No data available     Review of Systems  Constitutional: Negative for fever and chills.  Musculoskeletal: Positive for arthralgias (right knee).  Skin: Negative for wound.  Neurological: Negative for weakness and numbness.   Allergies  Morphine and related; Buspirone hcl; Celecoxib; Cephalexin; Enalapril maleate; Prednisone; Rofecoxib; and Tylenol  Home Medications   Prior to Admission medications   Not on File   BP 147/69 mmHg  Pulse 65  Temp(Src) 98.3 F (36.8 C) (Oral)  Resp 16  SpO2 99% Physical Exam   Constitutional: She appears well-developed and well-nourished. No distress.  HENT:  Head: Normocephalic and atraumatic.  Eyes: Right eye exhibits no discharge. Left eye exhibits no discharge.  Pulmonary/Chest: Effort normal. No respiratory distress.  Musculoskeletal:  No calf edema or tenderness bilaterally; no right knee joint effusion,no knee edema or deformity. Linear scar from prior surgery. Good range of motion of right knee. No right knee erythema or ecchymosis.  Patient is able to ambulatory out of the emergency department with normal gait.  Neurological: She is alert. Coordination normal.  Sensation intact to bilateral LE's; good strength with plantar and dorsal flexion bilaterally  Skin: Skin is warm and dry. No rash noted. She is not diaphoretic. No erythema. No pallor.  Psychiatric: She has a normal mood and affect. Her behavior is normal.  Nursing note and vitals reviewed.  ED Course  Procedures  DIAGNOSTIC STUDIES: Oxygen Saturation is 98% on RA, normal by my interpretation.    COORDINATION OF CARE: 6:12 PM Discussed next steps with pt including a DG of the right knee and reevaluation based on the results. She agreed to plan.   Labs Review Labs Reviewed - No data to display  Imaging Review Dg Knee Complete 4 Views Right  12/29/2014  CLINICAL DATA:  Right knee pain and swelling following fall, initial encounter EXAM: RIGHT KNEE - COMPLETE 4+ VIEW COMPARISON:  None. FINDINGS: Degenerative changes are noted in all 3 joint compartments. No acute  fracture or dislocation is seen. No joint effusion is noted. IMPRESSION: Degenerative change without acute abnormality. Electronically Signed   By: Inez Catalina M.D.   On: 12/29/2014 18:49   I have personally reviewed and evaluated these images as part of my medical decision-making.   EKG Interpretation None      Filed Vitals:   12/29/14 1748 12/29/14 1926  BP: 179/88 147/69  Pulse: 70 65  Temp: 98.3 F (36.8 C)    TempSrc: Oral   Resp: 16 16  SpO2: 98% 99%     MDM   Meds given in ED:  Medications - No data to display  New Prescriptions   No medications on file    Final diagnoses:  Right knee pain    This is a 64 y.o. female who presents to the Emergency Department complaining of constant, moderate, right knee pain with onset earlier today. Pt was walking down the steps at her home and felt a sudden onset of her current symptoms after feeling a pop in her right knee. She denies any knee pain when sitting but notes it greatly worsens when bearing weight. Pt denies a fall or any head trauma occurred. She notes a SHx to her right knee many years ago noting that it was probably a total knee replacement but is unsure. She denies numbness, tingling, weakness, fever and chills. She has been able to ambulate on her knee today.  On exam the patient is afebrile nontoxic appearing. She has no right knee joint effusion, edema, ecchymosis, erythema or warmth. She is good range of motion of right knee. Note right knee instability noted. No calf edema or tenderness noted bilaterally. Will check x-ray and reevaluate. X-ray shows degenerative changes without acute abnormality. No evidence of knee replacement on x-ray. Will provide the patient with a knee sleeve and encouraged use of crutches or walker until she can follow-up with orthopedic surgeon Dr. Sharol Given. I also encouraged her to use Tylenol as needed for pain, however the patient's friend reports she believes she is not supposed to take Tylenol according to her nephrologist. We had a discussion that Tylenol is acceptable to use with this patient has she has good liver function and has had poor kidney function in the past. I encouraged her to call her nephrologist to determine his advice prior to using NSAIDs. I advised her not to use NSAIDs at this time. I advised the patient to follow-up with their primary care provider this week. I advised the patient to return to  the emergency department with new or worsening symptoms or new concerns. The patient verbalized understanding and agreement with plan.    I personally performed the services described in this documentation, which was scribed in my presence. The recorded information has been reviewed and is accurate.           Waynetta Pean, PA-C 12/29/14 1937  Davonna Belling, MD 12/29/14 [phone]

## 2014-12-29 NOTE — Discharge Instructions (Signed)
Knee Pain Knee pain is a very common symptom and can have many causes. Knee pain often goes away when you follow your health care provider's instructions for relieving pain and discomfort at home. However, knee pain can develop into a condition that needs treatment. Some conditions may include:  Arthritis caused by wear and tear (osteoarthritis).  Arthritis caused by swelling and irritation (rheumatoid arthritis or gout).  A cyst or growth in your knee.  An infection in your knee joint.  An injury that will not heal.  Damage, swelling, or irritation of the tissues that support your knee (torn ligaments or tendinitis). If your knee pain continues, additional tests may be ordered to diagnose your condition. Tests may include X-rays or other imaging studies of your knee. You may also need to have fluid removed from your knee. Treatment for ongoing knee pain depends on the cause, but treatment may include:  Medicines to relieve pain or swelling.  Steroid injections in your knee.  Physical therapy.  Surgery. HOME CARE INSTRUCTIONS  Take medicines only as directed by your health care provider.  Rest your knee and keep it raised (elevated) while you are resting.  Do not do things that cause or worsen pain.  Avoid high-impact activities or exercises, such as running, jumping rope, or doing jumping jacks.  Apply ice to the knee area:  Put ice in a plastic bag.  Place a towel between your skin and the bag.  Leave the ice on for 20 minutes, 2-3 times a day.  Ask your health care provider if you should wear an elastic knee support.  Keep a pillow under your knee when you sleep.  Lose weight if you are overweight. Extra weight can put pressure on your knee.  Do not use any tobacco products, including cigarettes, chewing tobacco, or electronic cigarettes. If you need help quitting, ask your health care provider. Smoking may slow the healing of any bone and joint problems that you may  have. SEEK MEDICAL CARE IF:  Your knee pain continues, changes, or gets worse.  You have a fever along with knee pain.  Your knee buckles or locks up.  Your knee becomes more swollen. SEEK IMMEDIATE MEDICAL CARE IF:   Your knee joint feels hot to the touch.  You have chest pain or trouble breathing.   This information is not intended to replace advice given to you by your health care provider. Make sure you discuss any questions you have with your health care provider.   Document Released: 11/02/2006 Document Revised: 01/26/2014 Document Reviewed: 08/21/2013 Elsevier Interactive Patient Education 2016 [employer].  How to Use a Knee Brace A knee brace is a device that you wear to support your knee, especially if the knee is healing after an injury or surgery. There are several types of knee braces. Some are designed to prevent an injury (prophylactic brace). These are often worn during sports. Others support an injured knee (functional brace) or keep it still while it heals (rehabilitative brace). People with severe arthritis of the knee may benefit from a brace that takes some pressure off the knee (unloader brace). Most knee braces are made from a combination of cloth and metal or plastic.  You may need to wear a knee brace to:  Relieve knee pain.  Help your knee support your weight (improve stability).  Help you walk farther (improve mobility).  Prevent injury.  Support your knee while it heals from surgery or from an injury. RISKS AND COMPLICATIONS  Generally, knee braces are very safe to wear. However, problems may occur, including:  Skin irritation that may lead to infection.  Making your condition worse if you wear the brace in the wrong way. HOW TO USE A KNEE BRACE Different braces will have different instructions for use. Your health care provider will tell you or show you:  How to put on your brace.  How to adjust the brace.  When and how often to wear the  brace.  How to remove the brace.  If you will need any assistive devices in addition to the brace, such as crutches or a cane. In general, your brace should:  Have the hinge of the brace line up with the bend of your knee.  Have straps, hooks, or tapes that fasten snugly around your leg.  Not feel too tight or too loose. HOW TO CARE FOR A KNEE BRACE  Check your brace often for signs of damage, such as loose connections or attachments. Your knee brace may get damaged or wear out during normal use.  Wash the fabric parts of your brace with soap and water.  Read the insert that comes with your brace for other specific care instructions. SEEK MEDICAL CARE IF:  Your knee brace is too loose or too tight and you cannot adjust it.  Your knee brace causes skin redness, swelling, bruising, or irritation.  Your knee brace is not helping.  Your knee brace is making your knee pain worse.   This information is not intended to replace advice given to you by your health care provider. Make sure you discuss any questions you have with your health care provider.   Document Released: 03/28/2003 Document Revised: 09/26/2014 Document Reviewed: 04/30/2014 Elsevier Interactive Patient Education [employer].

## 2016-04-02 ENCOUNTER — Other Ambulatory Visit: Payer: Self-pay | Admitting: Gastroenterology

## 2016-06-01 NOTE — Anesthesia Preprocedure Evaluation (Addendum)
Anesthesia Evaluation  Patient identified by MRN, date of birth, ID band Patient awake    Reviewed: Allergy & Precautions, NPO status , Patient's Chart, lab work & pertinent test results  History of Anesthesia Complications Negative for: history of anesthetic complications  Airway Mallampati: II  TM Distance: >3 FB Neck ROM: Full    Dental no notable dental hx. (+) Dental Advisory Given   Pulmonary neg pulmonary ROS,    Pulmonary exam normal        Cardiovascular negative cardio ROS Normal cardiovascular exam     Neuro/Psych PSYCHIATRIC DISORDERS dementia    GI/Hepatic Neg liver ROS, GERD  ,  Endo/Other  negative endocrine ROS  Renal/GU negative Renal ROS     Musculoskeletal negative musculoskeletal ROS (+)   Abdominal   Peds  Hematology negative hematology ROS (+)   Anesthesia Other Findings Day of surgery medications reviewed with the patient.  Reproductive/Obstetrics                           Anesthesia Physical Anesthesia Plan  ASA: III  Anesthesia Plan: MAC   Post-op Pain Management:    Induction:   Airway Management Planned: Natural Airway and Simple Face Mask  Additional Equipment:   Intra-op Plan:   Post-operative Plan:   Informed Consent: I have reviewed the patients History and Physical, chart, labs and discussed the procedure including the risks, benefits and alternatives for the proposed anesthesia with the patient or authorized representative who has indicated his/her understanding and acceptance.   Dental advisory given  Plan Discussed with: Anesthesiologist and CRNA  Anesthesia Plan Comments:       Anesthesia Quick Evaluation

## 2016-06-02 ENCOUNTER — Encounter (HOSPITAL_COMMUNITY): Payer: Self-pay | Admitting: Anesthesiology

## 2016-06-02 ENCOUNTER — Ambulatory Visit (HOSPITAL_COMMUNITY)
Admission: RE | Admit: 2016-06-02 | Discharge: 2016-06-02 | Disposition: A | Discharge status: Home / Self Care | Payer: Medicare Other | Source: Ambulatory Visit | Attending: Gastroenterology | Admitting: Gastroenterology

## 2016-06-02 ENCOUNTER — Encounter (HOSPITAL_COMMUNITY)
Admission: RE | Disposition: A | Discharge status: Home / Self Care | Payer: Self-pay | Source: Ambulatory Visit | Attending: Gastroenterology

## 2016-06-02 ENCOUNTER — Ambulatory Visit (HOSPITAL_COMMUNITY): Payer: Medicare Other | Admitting: Anesthesiology

## 2016-06-02 DIAGNOSIS — K562 Volvulus: Secondary | ICD-10-CM | POA: Diagnosis not present

## 2016-06-02 DIAGNOSIS — F329 Major depressive disorder, single episode, unspecified: Secondary | ICD-10-CM | POA: Diagnosis not present

## 2016-06-02 DIAGNOSIS — Z1211 Encounter for screening for malignant neoplasm of colon: Secondary | ICD-10-CM | POA: Insufficient documentation

## 2016-06-02 DIAGNOSIS — E119 Type 2 diabetes mellitus without complications: Secondary | ICD-10-CM | POA: Diagnosis not present

## 2016-06-02 DIAGNOSIS — D120 Benign neoplasm of cecum: Secondary | ICD-10-CM | POA: Diagnosis not present

## 2016-06-02 DIAGNOSIS — D125 Benign neoplasm of sigmoid colon: Secondary | ICD-10-CM | POA: Diagnosis not present

## 2016-06-02 DIAGNOSIS — K219 Gastro-esophageal reflux disease without esophagitis: Secondary | ICD-10-CM | POA: Diagnosis not present

## 2016-06-02 DIAGNOSIS — F0390 Unspecified dementia without behavioral disturbance: Secondary | ICD-10-CM | POA: Insufficient documentation

## 2016-06-02 HISTORY — PX: COLONOSCOPY WITH PROPOFOL: SHX5780

## 2016-06-02 SURGERY — COLONOSCOPY WITH PROPOFOL
Anesthesia: Monitor Anesthesia Care

## 2016-06-02 MED ORDER — LACTATED RINGERS IV SOLN
INTRAVENOUS | Status: DC | PRN
  Administered 2016-06-02: 08:00:00 via INTRAVENOUS

## 2016-06-02 MED ORDER — PROPOFOL 10 MG/ML IV BOLUS
INTRAVENOUS | Status: AC
  Filled 2016-06-02: qty 20

## 2016-06-02 MED ORDER — PROPOFOL 500 MG/50ML IV EMUL
INTRAVENOUS | Status: DC | PRN
  Administered 2016-06-02: 30 mg via INTRAVENOUS
  Administered 2016-06-02: 50 mg via INTRAVENOUS

## 2016-06-02 MED ORDER — PROPOFOL 500 MG/50ML IV EMUL
INTRAVENOUS | Status: DC | PRN
  Administered 2016-06-02: 125 ug/kg/min via INTRAVENOUS

## 2016-06-02 MED ORDER — SODIUM CHLORIDE 0.9 % IV SOLN: INTRAVENOUS | Status: DC

## 2016-06-02 MED ORDER — PROPOFOL 10 MG/ML IV BOLUS
INTRAVENOUS | Status: AC
  Filled 2016-06-02: qty 40

## 2016-06-02 SURGICAL SUPPLY — 21 items

## 2016-06-02 NOTE — H&P (Signed)
Procedure: Baseline screening colonoscopy  History: The patient is a 66 year old female born April 21, 1950. She is scheduled to undergo her first screening colonoscopy with polypectomy to prevent colon cancer.  Past medical history: Total abdominal hysterectomy. Knee surgery. Type 2 diabetes mellitus. Gastroesophageal reflux. Major depression with dementia.  Medication allergies: Morphine caused syncope. Lithium.  Exam: The patient is alert and lying comfortably on the endoscopy stretcher. Abdomen is soft and nontender to palpation. Lungs are clear to auscultation. Cardiac exam reveals a regular rhythm.  Plan: Proceed with baseline screening colonoscopy

## 2016-06-02 NOTE — Transfer of Care (Signed)
Immediate Anesthesia Transfer of Care Note  Patient: Shirley [name]  Procedure(s) Performed: Procedure(s): COLONOSCOPY WITH PROPOFOL (N/A)  Patient Location: PACU and Endoscopy Unit  Anesthesia Type:MAC  Level of Consciousness: sedated  Airway & Oxygen Therapy: Patient Spontanous Breathing and Patient connected to face mask oxygen  Post-op Assessment: Report given to RN and Post -op Vital signs reviewed and stable  Post vital signs: Reviewed and stable  Last Vitals:  Vitals:   06/02/16 0730  BP: (!) 178/95  Pulse: 77  Resp: 16  Temp: 36.6 C    Last Pain:  Vitals:   06/02/16 0730  TempSrc: Oral         Complications: No apparent anesthesia complications

## 2016-06-02 NOTE — Op Note (Signed)
[facility] Patient Name: Shirley [name] Procedure Date: 06/02/2016 MRN: [id number] Attending MD: Garlan Fair , MD Date of Birth: July 14, 1950 CSN: [id number] Age: 66 Admit Type: Outpatient Procedure:                Colonoscopy Indications:              Screening for colorectal malignant neoplasm:                            baseline exam Providers:                Garlan Fair, MD, Zenon Mayo, RN, Cherylynn Ridges, Technician, Arnoldo Hooker, CRNA Referring MD:              Medicines:                Propofol per Anesthesia Complications:            No immediate complications. Estimated Blood Loss:     Estimated blood loss was minimal. Procedure:                Pre-Anesthesia Assessment:                           - Prior to the procedure, a History and Physical                            was performed, and patient medications and                            allergies were reviewed. The patient's tolerance of                            previous anesthesia was also reviewed. The risks                            and benefits of the procedure and the sedation                            options and risks were discussed with the patient.                            All questions were answered, and informed consent                            was obtained. Prior Anticoagulants: The patient has                            taken no previous anticoagulant or antiplatelet                            agents. ASA Grade Assessment: II - A patient with  mild systemic disease. After reviewing the risks                            and benefits, the patient was deemed in                            satisfactory condition to undergo the procedure.                           After obtaining informed consent, the colonoscope                            was passed under direct vision. Throughout the                            procedure, the  patient's blood pressure, pulse, and                            oxygen saturations were monitored continuously. The                            EC-[id number] ([id number]) scope was introduced through                            the anus and advanced to the the cecum, identified                            by appendiceal orifice and ileocecal valve. The                            colonoscopy was somewhat difficult due to                            significant looping. The patient tolerated the                            procedure well. The quality of the bowel                            preparation was good. The appendiceal orifice and                            the rectum were photographed. Scope In: 8:04:59 AM Scope Out: 8:34:40 AM Scope Withdrawal Time: 0 hours 20 minutes 19 seconds  Total Procedure Duration: 0 hours 29 minutes 41 seconds  Findings:      The perianal and digital rectal examinations were normal.      A 12 mm polyp was found in the proximal ascending colon. The polyp was       sessile. The polyp was removed with a piecemeal technique using a cold       and hot snare. Resection and retrieval were complete. Two endoclips were       applied to the polypectomy site.      A 5 mm polyp was found in the proximal sigmoid colon. The  polyp was       sessile. The polyp was removed with a cold snare. Resection and       retrieval were complete.      A 3 mm polyp was found in the mid sigmoid colon. The polyp was sessile.       The polyp was removed with a cold biopsy forceps. Resection and       retrieval were complete.      The exam was otherwise without abnormality. Left colonic diverticulosis       was present. Impression:               - One 12 mm polyp in the proximal ascending colon,                            removed piecemeal using a hot snare. Resected and                            retrieved.                           - One 5 mm polyp in the proximal sigmoid colon,                             removed with a cold snare. Resected and retrieved.                           - One 3 mm polyp in the mid sigmoid colon, removed                            with a cold biopsy forceps. Resected and retrieved.                           - The examination was otherwise normal. Moderate Sedation:      N/A- Per Anesthesia Care Recommendation:           - Patient has a contact number available for                            emergencies. The signs and symptoms of potential                            delayed complications were discussed with the                            patient. Return to normal activities tomorrow.                            Written discharge instructions were provided to the                            patient.                           - Repeat colonoscopy in 3 years for surveillance.                           -  Resume previous diet.                           - Continue present medications. Procedure Code(s):        --- Professional ---                           [phone], Colonoscopy, flexible; with removal of                            tumor(s), polyp(s), or other lesion(s) by snare                            technique                           45380, 47, Colonoscopy, flexible; with biopsy,                            single or multiple Diagnosis Code(s):        --- Professional ---                           Z12.11, Encounter for screening for malignant                            neoplasm of colon                           D12.2, Benign neoplasm of ascending colon                           D12.5, Benign neoplasm of sigmoid colon CPT copyright 2016 [employer]. All rights reserved. The codes documented in this report are preliminary and upon coder review may  be revised to meet current compliance requirements. Earle Gell, MD Garlan Fair, MD 06/02/2016 8:46:11 AM This report has been signed electronically. Number of Addenda: 0

## 2016-06-02 NOTE — Anesthesia Postprocedure Evaluation (Addendum)
Anesthesia Post Note  Patient: Shirley [name]  Procedure(s) Performed: Procedure(s) (LRB): COLONOSCOPY WITH PROPOFOL (N/A)  Patient location during evaluation: Endoscopy Anesthesia Type: MAC Level of consciousness: awake and alert Pain management: pain level controlled Vital Signs Assessment: post-procedure vital signs reviewed and stable Respiratory status: spontaneous breathing and respiratory function stable Cardiovascular status: stable Anesthetic complications: no       Last Vitals:  Vitals:   06/02/16 0900 06/02/16 0910  BP: (!) 152/91 (!) 182/90  Pulse: (!) 58 (!) 113  Resp: 19 12  Temp:      Last Pain:  Vitals:   06/02/16 0910  TempSrc:   PainSc: 0-No pain                 [name] DANIEL

## 2016-06-02 NOTE — Discharge Instructions (Signed)

## 2016-06-03 ENCOUNTER — Encounter (HOSPITAL_COMMUNITY): Payer: Self-pay | Admitting: Gastroenterology

## 2016-06-20 NOTE — Addendum Note (Signed)
Addendum  created 06/20/16 1008 by Duane Boston, MD   Sign clinical note

## 2016-08-24 ENCOUNTER — Other Ambulatory Visit: Payer: Self-pay | Admitting: Internal Medicine

## 2016-08-24 DIAGNOSIS — Z1231 Encounter for screening mammogram for malignant neoplasm of breast: Secondary | ICD-10-CM

## 2016-09-01 ENCOUNTER — Ambulatory Visit: Payer: Medicare Other

## 2017-07-06 ENCOUNTER — Other Ambulatory Visit: Payer: Self-pay | Admitting: Internal Medicine

## 2017-07-06 ENCOUNTER — Ambulatory Visit
Admission: RE | Admit: 2017-07-06 | Discharge: 2017-07-06 | Disposition: A | Discharge status: Home / Self Care | Payer: Medicare Other | Source: Ambulatory Visit | Attending: Internal Medicine | Admitting: Internal Medicine

## 2017-07-06 DIAGNOSIS — M545 Low back pain: Secondary | ICD-10-CM

## 2018-02-04 ENCOUNTER — Other Ambulatory Visit: Payer: Self-pay | Admitting: Internal Medicine

## 2018-02-04 DIAGNOSIS — Z780 Asymptomatic menopausal state: Secondary | ICD-10-CM

## 2018-02-04 DIAGNOSIS — Z1231 Encounter for screening mammogram for malignant neoplasm of breast: Secondary | ICD-10-CM

## 2018-02-08 ENCOUNTER — Other Ambulatory Visit: Payer: Self-pay | Admitting: Internal Medicine

## 2018-02-08 DIAGNOSIS — E2839 Other primary ovarian failure: Secondary | ICD-10-CM

## 2018-04-01 ENCOUNTER — Ambulatory Visit: Payer: Medicare Other

## 2018-04-01 ENCOUNTER — Other Ambulatory Visit: Payer: Medicare Other

## 2018-05-06 ENCOUNTER — Ambulatory Visit: Payer: Medicare Other

## 2018-05-06 ENCOUNTER — Other Ambulatory Visit: Payer: Medicare Other

## 2019-03-16 ENCOUNTER — Inpatient Hospital Stay (HOSPITAL_COMMUNITY): Payer: Medicare Other

## 2019-03-16 ENCOUNTER — Emergency Department (HOSPITAL_COMMUNITY): Payer: Medicare Other

## 2019-03-16 ENCOUNTER — Encounter (HOSPITAL_COMMUNITY): Payer: Self-pay | Admitting: Emergency Medicine

## 2019-03-16 ENCOUNTER — Inpatient Hospital Stay (HOSPITAL_COMMUNITY)
Admission: EM | Admit: 2019-03-16 | Discharge: 2019-03-21 | DRG: 177 | Disposition: A | Discharge status: Skilled Nursing Facility | Payer: Medicare Other | Attending: Internal Medicine | Admitting: Internal Medicine

## 2019-03-16 ENCOUNTER — Other Ambulatory Visit: Payer: Self-pay

## 2019-03-16 DIAGNOSIS — G309 Alzheimer's disease, unspecified: Secondary | ICD-10-CM | POA: Diagnosis present

## 2019-03-16 DIAGNOSIS — J690 Pneumonitis due to inhalation of food and vomit: Principal | ICD-10-CM | POA: Diagnosis present

## 2019-03-16 DIAGNOSIS — I351 Nonrheumatic aortic (valve) insufficiency: Secondary | ICD-10-CM | POA: Diagnosis present

## 2019-03-16 DIAGNOSIS — G9341 Metabolic encephalopathy: Secondary | ICD-10-CM | POA: Diagnosis present

## 2019-03-16 DIAGNOSIS — K219 Gastro-esophageal reflux disease without esophagitis: Secondary | ICD-10-CM | POA: Diagnosis present

## 2019-03-16 DIAGNOSIS — K589 Irritable bowel syndrome without diarrhea: Secondary | ICD-10-CM | POA: Diagnosis present

## 2019-03-16 DIAGNOSIS — I82431 Acute embolism and thrombosis of right popliteal vein: Secondary | ICD-10-CM | POA: Diagnosis present

## 2019-03-16 DIAGNOSIS — R4182 Altered mental status, unspecified: Secondary | ICD-10-CM | POA: Diagnosis present

## 2019-03-16 DIAGNOSIS — I82409 Acute embolism and thrombosis of unspecified deep veins of unspecified lower extremity: Secondary | ICD-10-CM

## 2019-03-16 DIAGNOSIS — R1310 Dysphagia, unspecified: Secondary | ICD-10-CM | POA: Diagnosis present

## 2019-03-16 DIAGNOSIS — T17908A Unspecified foreign body in respiratory tract, part unspecified causing other injury, initial encounter: Secondary | ICD-10-CM

## 2019-03-16 DIAGNOSIS — Z820 Family history of epilepsy and other diseases of the nervous system: Secondary | ICD-10-CM | POA: Diagnosis not present

## 2019-03-16 DIAGNOSIS — Z7189 Other specified counseling: Secondary | ICD-10-CM

## 2019-03-16 DIAGNOSIS — Z66 Do not resuscitate: Secondary | ICD-10-CM | POA: Diagnosis present

## 2019-03-16 DIAGNOSIS — Z20822 Contact with and (suspected) exposure to covid-19: Secondary | ICD-10-CM | POA: Diagnosis present

## 2019-03-16 DIAGNOSIS — Z781 Physical restraint status: Secondary | ICD-10-CM | POA: Diagnosis not present

## 2019-03-16 DIAGNOSIS — M549 Dorsalgia, unspecified: Secondary | ICD-10-CM | POA: Diagnosis present

## 2019-03-16 DIAGNOSIS — N19 Unspecified kidney failure: Secondary | ICD-10-CM

## 2019-03-16 DIAGNOSIS — Z79899 Other long term (current) drug therapy: Secondary | ICD-10-CM | POA: Diagnosis not present

## 2019-03-16 DIAGNOSIS — Z515 Encounter for palliative care: Secondary | ICD-10-CM

## 2019-03-16 DIAGNOSIS — F0280 Dementia in other diseases classified elsewhere without behavioral disturbance: Secondary | ICD-10-CM | POA: Diagnosis present

## 2019-03-16 DIAGNOSIS — E860 Dehydration: Secondary | ICD-10-CM | POA: Diagnosis present

## 2019-03-16 DIAGNOSIS — I639 Cerebral infarction, unspecified: Secondary | ICD-10-CM

## 2019-03-16 DIAGNOSIS — R531 Weakness: Secondary | ICD-10-CM

## 2019-03-16 DIAGNOSIS — M5136 Other intervertebral disc degeneration, lumbar region: Secondary | ICD-10-CM | POA: Diagnosis present

## 2019-03-16 DIAGNOSIS — M1710 Unilateral primary osteoarthritis, unspecified knee: Secondary | ICD-10-CM | POA: Diagnosis present

## 2019-03-16 DIAGNOSIS — Z791 Long term (current) use of non-steroidal anti-inflammatories (NSAID): Secondary | ICD-10-CM | POA: Diagnosis not present

## 2019-03-16 DIAGNOSIS — M5137 Other intervertebral disc degeneration, lumbosacral region: Secondary | ICD-10-CM | POA: Diagnosis present

## 2019-03-16 DIAGNOSIS — F0390 Unspecified dementia without behavioral disturbance: Secondary | ICD-10-CM

## 2019-03-16 DIAGNOSIS — R410 Disorientation, unspecified: Secondary | ICD-10-CM | POA: Diagnosis not present

## 2019-03-16 DIAGNOSIS — N179 Acute kidney failure, unspecified: Secondary | ICD-10-CM | POA: Diagnosis present

## 2019-03-16 DIAGNOSIS — F0281 Dementia in other diseases classified elsewhere with behavioral disturbance: Secondary | ICD-10-CM | POA: Diagnosis not present

## 2019-03-16 LAB — CBC
HCT: 40.8 % (ref 36.0–46.0)
Hemoglobin: 13.2 g/dL (ref 12.0–15.0)
MCH: 31.9 pg (ref 26.0–34.0)
MCHC: 32.4 g/dL (ref 30.0–36.0)
MCV: 98.6 fL (ref 80.0–100.0)
Platelets: 204 10*3/uL (ref 150–400)
RBC: 4.14 MIL/uL (ref 3.87–5.11)
RDW: 12.3 % (ref 11.5–15.5)
WBC: 10.1 10*3/uL (ref 4.0–10.5)
nRBC: 0 % (ref 0.0–0.2)

## 2019-03-16 LAB — URINALYSIS, ROUTINE W REFLEX MICROSCOPIC
Bilirubin Urine: NEGATIVE
Glucose, UA: NEGATIVE mg/dL
Hgb urine dipstick: NEGATIVE
Ketones, ur: NEGATIVE mg/dL
Leukocytes,Ua: NEGATIVE
Nitrite: NEGATIVE
Protein, ur: NEGATIVE mg/dL
Specific Gravity, Urine: 1.02 (ref 1.005–1.030)
pH: 5 (ref 5.0–8.0)

## 2019-03-16 LAB — BASIC METABOLIC PANEL
Anion gap: 11 (ref 5–15)
BUN: 81 mg/dL — ABNORMAL HIGH (ref 8–23)
CO2: 22 mmol/L (ref 22–32)
Calcium: 9.4 mg/dL (ref 8.9–10.3)
Chloride: 105 mmol/L (ref 98–111)
Creatinine, Ser: 2.07 mg/dL — ABNORMAL HIGH (ref 0.44–1.00)
GFR calc Af Amer: 28 mL/min — ABNORMAL LOW (ref >60)
GFR calc non Af Amer: 24 mL/min — ABNORMAL LOW (ref >60)
Glucose, Bld: 174 mg/dL — ABNORMAL HIGH (ref 70–99)
Potassium: 4.3 mmol/L (ref 3.5–5.1)
Sodium: 138 mmol/L (ref 135–145)

## 2019-03-16 LAB — CBG MONITORING, ED: Glucose-Capillary: 168 mg/dL — ABNORMAL HIGH (ref 70–99)

## 2019-03-16 MED ORDER — ACETAMINOPHEN 650 MG RE SUPP: 650.0000 mg | RECTAL | Status: DC | PRN

## 2019-03-16 MED ORDER — ASPIRIN 81 MG PO CHEW
81.0000 mg | CHEWABLE_TABLET | Freq: Every day | ORAL | Status: DC
  Administered 2019-03-17 – 2019-03-21 (×6): 81 mg via ORAL
  Filled 2019-03-16 (×6): qty 1

## 2019-03-16 MED ORDER — STROKE: EARLY STAGES OF RECOVERY BOOK
Freq: Once | Status: AC
  Filled 2019-03-16: qty 1

## 2019-03-16 MED ORDER — SODIUM CHLORIDE 0.9% FLUSH: 3.0000 mL | Freq: Once | INTRAVENOUS | Status: DC

## 2019-03-16 MED ORDER — ASPIRIN 325 MG PO TABS: 325.0000 mg | ORAL_TABLET | Freq: Every day | ORAL | Status: DC

## 2019-03-16 MED ORDER — SODIUM CHLORIDE 0.9 % IV BOLUS
1000.0000 mL | Freq: Once | INTRAVENOUS | Status: AC
  Administered 2019-03-16: 1000 mL via INTRAVENOUS

## 2019-03-16 MED ORDER — ACETAMINOPHEN 325 MG PO TABS: 650.0000 mg | ORAL_TABLET | ORAL | Status: DC | PRN

## 2019-03-16 MED ORDER — HYOSCYAMINE SULFATE ER 0.375 MG PO TB12
0.3750 mg | ORAL_TABLET | Freq: Once | ORAL | Status: DC
  Filled 2019-03-16: qty 1

## 2019-03-16 MED ORDER — SENNOSIDES-DOCUSATE SODIUM 8.6-50 MG PO TABS
1.0000 | ORAL_TABLET | Freq: Every evening | ORAL | Status: DC | PRN
  Filled 2019-03-16: qty 1

## 2019-03-16 MED ORDER — HEPARIN SODIUM (PORCINE) 5000 UNIT/ML IJ SOLN
5000.0000 [IU] | Freq: Three times a day (TID) | INTRAMUSCULAR | Status: DC
  Administered 2019-03-17 – 2019-03-20 (×11): 5000 [IU] via SUBCUTANEOUS
  Filled 2019-03-16 (×11): qty 1

## 2019-03-16 MED ORDER — ASPIRIN 300 MG RE SUPP: 300.0000 mg | Freq: Every day | RECTAL | Status: DC

## 2019-03-16 MED ORDER — ACETAMINOPHEN 160 MG/5ML PO SOLN: 650.0000 mg | ORAL | Status: DC | PRN

## 2019-03-16 MED ORDER — DIVALPROEX SODIUM 125 MG PO DR TAB
125.0000 mg | DELAYED_RELEASE_TABLET | Freq: Two times a day (BID) | ORAL | Status: DC
  Administered 2019-03-17 – 2019-03-21 (×10): 125 mg via ORAL
  Filled 2019-03-16 (×13): qty 1

## 2019-03-16 NOTE — ED Provider Notes (Signed)
[facility] Provider Note   CSN: [id number] Arrival date & time: 03/16/19  1537     History Chief Complaint  Patient presents with  . Weakness    Latese Luepke Chhim is a 69 y.o. female.  The history is provided by the patient. No language interpreter was used.  Weakness Severity:  Moderate Onset quality:  Gradual Timing:  Constant Progression:  Worsening Chronicity:  New Worsened by:  Nothing Ineffective treatments:  None tried Risk factors: neurologic disease   Risk factors: no anemia    Pt's sister reports pt has started using right arm and right leg less over the past 2-3 days.  She thinks pt may have had a stroke.  She was seen by Dr. Landry Corporal today and told to come in.  Dr. Landry Corporal has advised that he feels like it is time for Hospice to come into the home.   Pt is not eating. Pt is drinking some  protein shake.   Past Medical History:  Diagnosis Date  . Alzheimer's dementia ([place])   . Renal disorder     Patient Active Problem List   Diagnosis Date Noted  . Personal history of colonic polyps 10/23/2009  . GERD 09/05/2009  . IRRITABLE BOWEL SYNDROME 09/05/2009  . DIARRHEA 09/05/2009  . ABDOMINAL PAIN, UNSPECIFIED SITE 09/05/2009  . 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 FOOT SPRAIN 08/31/2007  . OSTEOARTHRITIS, LOWER LEG 12/30/2006  . BASILAR JOINT ARTHRITIS 12/30/2006  . BACK PAIN 12/30/2006  . HIGH BLOOD PRESSURE 12/29/2006    Past Surgical History:  Procedure Laterality Date  . COLONOSCOPY WITH PROPOFOL N/A 06/02/2016   Procedure: COLONOSCOPY WITH PROPOFOL;  Surgeon: Garlan Fair, MD;  Location: [facility] ENDOSCOPY;  Service: Endoscopy;  Laterality: N/A;  . REPLACEMENT TOTAL KNEE       OB History   No obstetric history on file.     History reviewed. No pertinent family history.  Social History   Tobacco Use  . Smoking status: Never Smoker  . Smokeless tobacco: Never Used  Substance Use Topics  . Alcohol use: No   . Drug use: No    Home Medications Prior to Admission medications   Medication Sig Start Date End Date Taking? Authorizing Provider  divalproex (DEPAKOTE) 125 MG DR tablet Take 125 mg by mouth 2 (two) times daily.    [provider]  donepezil (ARICEPT) 10 MG tablet Take 10 mg by mouth at bedtime.    [provider]  ibuprofen (ADVIL,MOTRIN) 200 MG tablet Take 400 mg by mouth daily as needed for headache.    [provider]  QUEtiapine (SEROQUEL) 100 MG tablet Take 100 mg by mouth 2 (two) times daily.    [provider]    Allergies    Morphine and related, Buspirone hcl, Celecoxib, Cephalexin, Enalapril maleate, Lithium, Prednisone, Rofecoxib, and Tylenol [acetaminophen]  Review of Systems   Review of Systems  Neurological: Positive for weakness.  All other systems reviewed and are negative.   Physical Exam Updated Vital Signs BP (!) 143/115   Pulse 91   Temp 97.6 F (36.4 C) (Temporal)   Resp 19   Ht 5\' 3"  (1.6 m)   Wt 74.8 kg   SpO2 98%   BMI 29.23 kg/m   Physical Exam Vitals and nursing note reviewed.  Constitutional:      Appearance: She is well-developed.  HENT:     Head: Normocephalic.     Mouth/Throat:     Mouth: Mucous  membranes are moist.  Eyes:     Pupils: Pupils are equal, round, and reactive to light.  Cardiovascular:     Rate and Rhythm: Normal rate.  Pulmonary:     Effort: Pulmonary effort is normal.  Abdominal:     General: There is no distension.  Musculoskeletal:        General: No deformity.     Cervical back: Normal range of motion.     Comments: Pt unable to comply with exam,  Pt appears to have decreased use of right arm.  Skin:    General: Skin is warm.  Neurological:     Mental Status: She is alert. Mental status is at baseline.     ED Results / Procedures / Treatments   Labs (all labs ordered are listed, but only abnormal results are displayed) Labs Reviewed  BASIC METABOLIC PANEL -  Abnormal; Notable for the following components:      Result Value   Glucose, Bld 174 (*)    BUN 81 (*)    Creatinine, Ser 2.07 (*)    GFR calc non Af Amer 24 (*)    GFR calc Af Amer 28 (*)    All other components within normal limits  CBG MONITORING, ED - Abnormal; Notable for the following components:   Glucose-Capillary 168 (*)    All other components within normal limits  CBC  URINALYSIS, ROUTINE W REFLEX MICROSCOPIC    EKG EKG Interpretation  Date/Time:  Thursday March 16 2019 16:22:06 EST Ventricular Rate:  92 PR Interval:    QRS Duration: 93 QT Interval:  348 QTC Calculation: 431 R Axis:   -36 Text Interpretation: Sinus rhythm Inferior infarct, old Consider anterior infarct Confirmed by Fredia Sorrow [phone]) on 03/16/2019 4:34:16 PM   Radiology CT Head Wo Contrast  Result Date: 03/16/2019 CLINICAL DATA:  Right-sided weakness since 03/15/2019. EXAM: CT HEAD WITHOUT CONTRAST TECHNIQUE: Contiguous axial images were obtained from the base of the skull through the vertex without intravenous contrast. COMPARISON:  April 22, 2010 FINDINGS: Brain: No evidence of acute infarction, hemorrhage, hydrocephalus, extra-axial collection or mass lesion/mass effect. Atrophy and chronic microvascular ischemic changes are noted. Vascular: No hyperdense vessel or unexpected calcification. Skull: Normal. Negative for fracture or focal lesion. Sinuses/Orbits: No acute finding. Other: None. IMPRESSION: 1. Motion degraded study. 2. No acute abnormality. 3. Atrophy and chronic microvascular ischemic changes are noted. Electronically Signed   By: Constance Holster M.D.   On: 03/16/2019 18:15    Procedures Procedures (including critical care time)  Medications Ordered in ED Medications  sodium chloride flush (NS) 0.9 % injection 3 mL (has no administration in time range)  sodium chloride 0.9 % bolus 1,000 mL (has no administration in time range)    ED Course  I have reviewed the triage  vital signs and the nursing notes.  Pertinent labs & imaging results that were available during my care of the patient were reviewed by me and considered in my medical decision making (see chart for details).  Clinical Course as of Mar 15 2021  Thu Mar 16, 2019  1904 Glucose(!): 174 [LS]    Clinical Course User Index [LS] Fransico Meadow, [place]   MDM Rules/Calculators/A&P                      CT scan shows no acute cva.  BUn and creatine are elevated.  Pt given IV fluid x 1 liter.  Pt may have had a cva, possible  weakness due to dehydration.  Hospitalist consulted.  Final Clinical Impression(s) / ED Diagnoses Final diagnoses:  Generalized weakness  Dehydration    Rx / DC Orders ED Discharge Orders    None       Sidney Ace 03/16/19 2133    Fredia Sorrow, MD 03/17/19 [phone]

## 2019-03-16 NOTE — H&P (Addendum)
History and Physical    Patient Demographics:    Shirley [name] [id number] DOB: [birth year] DOA: 03/16/2019  PCP: Wenda Low, MD  Patient coming from: Home  I have personally briefly reviewed patient's old medical records in [place]  Chief Complaint: Altered Mental Status    Assessment & Plan:     Assessment/Plan Principal Problem:   Altered mental status Active Problems:   GERD   Irritable bowel syndrome   OSTEOARTHRITIS, LOWER LEG   DEGENERATIVE DISC DISEASE, LUMBAR SPINE   Backache   Dementia ([place])   Renal failure   CVA (cerebral vascular accident) ([place])     Principal Problem: Altered mental status Patient has significant change in mental status over the last 48 to 72 hours in the form of increased fatigue, decreased PO intake, possible right sided weakness, dysphagia.  Noted to have have worsened renal function.  CT of the head shows no acute abnormalities.  Neuro exam is essentially nonfocal although significantly limited due to underlying history of dementia.  Has significant baseline history of dementia and has had decline over the last several months.  Differential diagnosis include acute CVA vs metabolic encephalopathy in the setting of underlying severe dementia.  -Neurochecks every 4 hours -Telemetry monitoring -Will send additional work-up, B12, TSH -Keep n.p.o. for now -PT/OT/speech evaluation -MRI brain to rule out acute CVA -We will place on aspirin 81 mg daily -Aspiration, fall precautions -Palliative care consult has been ordered for AM  Other Active Problems: Renal failure, possibly acute, unknown baseline Patient presented with a creatinine of 2.07.  Most recent creatinine is 0.9 in 2012.  No recent labs available.  Suspect is likely acute secondary to decreased p.o. intake. -We will give IV fluid resuscitation -Monitor input output, renal function closely  Alzheimer's dementia Patient with history of significant dementia at  baseline. She is confused and minimally verbal. Only follows basic commands. Noted to have significant decline over the last several months.  Patient sister was considering placing her in a skilled nursing facility versus getting additional help at home in the form of hospice. -Currently only on depakote -Palliative care consult  DVT prophylaxis: Heparin Code Status:  DNR/DNI based on discussion with sister at bedside  Family Communication: N/A  Disposition Plan: Admitted as inpatient for renal failure, altered mental status for possible CVA Consults called: N/A Admission status: Inpatient stay    HPI:     HPI: Shirley [name] is a 69 y.o. female with medical history significant of Alzheimer's dementia who presented to the ER with worsening altered mental status. History has been obtained from the ER record and the family member as the patient has altered mental status and baseline dementia and is unable to provide history. Patient was diagnosed with dementia about 8 years ago.  She has had progressively worsened dementia over the last year and over last 3 months her symptoms have gotten worse with increased confusion, decreased PO intake, increased physical deconditioning.  Patient's sister reports she has noticed that the patient was using her right arm less than usual over the last 2 to 3 days.  She went to see the patient's primary care provider who sent her in for evaluation of possible stroke. Patient sister usually feeds on with protein shakes at home due to decreased p.o. intake in general but constant she has been choking on the protein shakes over the last several days. No fever, chills, shortness of breath, nausea, vomiting, abdominal pain, diarrhea reported.  Sister has  not noticed patient deviation but has noticed weakness use of the right upper extremity as noted above. ED Course:  Vital Signs reviewed on presentation, significant for temperature 97.6, blood pressure 133/86,  heart rate 87, saturation 99% on room air. Labs reviewed, significant for sodium 136, potassium 4.3, BUN 11, creatinine 2.07, WBC 14.1, hemoglobin 13.2, hematocrit 40.4 platelets 204, urinalysis was negative, specific gravity is 1.020. Imaging personally Reviewed, CT of the head showed no acute abnormalities.  No evidence of acute infarction or hemorrhage.  Study is limited secondary to motion artifact.  Chronic atrophy noted. EKG personally reviewed, shows sinus rhythm, no acute ST-T changes.    Review of systems:    Review of Systems: Unable to do review of systems as the patient has dementia.   Past Medical and Surgical History:  Reviewed by me  Past Medical History:  Diagnosis Date  . Alzheimer's dementia ([place])   . Renal disorder     Past Surgical History:  Procedure Laterality Date  . COLONOSCOPY WITH PROPOFOL N/A 06/02/2016   Procedure: COLONOSCOPY WITH PROPOFOL;  Surgeon: Garlan Fair, MD;  Location: [facility];  Service: Endoscopy;  Laterality: N/A;  . REPLACEMENT TOTAL KNEE       Social History:  Reviewed by me   reports that she has never smoked. She has never used smokeless tobacco. She reports that she does not drink alcohol or use drugs.  Allergies:    Allergies  Allergen Reactions  . Morphine And Related Anaphylaxis  . Buspirone Hcl Other (See Comments)    Unknown   . Celecoxib Other (See Comments)    Unknown   . Cephalexin Other (See Comments)    Unknown   . Enalapril Maleate Other (See Comments)    Unknown   . Lithium   . Prednisone Other (See Comments)    Unknown   . Rofecoxib Other (See Comments)    Vioxx- Unknown   . Tylenol [Acetaminophen] Other (See Comments)    Unknown     Family History :   History reviewed. No pertinent family history. Family history reviewed, noted as above, not pertinent to current presentation.   Home Medications:    Prior to Admission medications   Medication Sig Start Date End Date Taking? Authorizing  Provider  divalproex (DEPAKOTE) 125 MG DR tablet Take 125 mg by mouth 2 (two) times daily.    [provider]  donepezil (ARICEPT) 10 MG tablet Take 10 mg by mouth at bedtime.    [provider]  ibuprofen (ADVIL,MOTRIN) 200 MG tablet Take 400 mg by mouth daily as needed for headache.    [provider]  QUEtiapine (SEROQUEL) 100 MG tablet Take 100 mg by mouth 2 (two) times daily.    [provider]    Physical Exam:    Physical Exam: Vitals:   03/16/19 1900 03/16/19 1930 03/16/19 2000 03/16/19 2030  BP: (!) 128/93 137/78 132/69 133/86  Pulse: 97     Resp: 17 13 15 15   Temp:      TempSrc:      SpO2: 100%     Weight:      Height:        Constitutional: NAD, calm, comfortable Vitals:   03/16/19 1900 03/16/19 1930 03/16/19 2000 03/16/19 2030  BP: (!) 128/93 137/78 132/69 133/86  Pulse: 97     Resp: 17 13 15 15   Temp:      TempSrc:      SpO2: 100%  Weight:      Height:       Eyes: PERRL, lids and conjunctivae normal ENMT: Mucous membranes are moist. Posterior pharynx clear of any exudate or lesions.Normal dentition.  Neck: normal, supple, no masses, no thyromegaly Respiratory: clear to auscultation bilaterally, no wheezing, no crackles. Normal respiratory effort. No accessory muscle use.  Cardiovascular: Regular rate and rhythm, no murmurs / rubs / gallops. No extremity edema. 2+ pedal pulses. No carotid bruits.  Abdomen: no tenderness, no masses palpated. No hepatosplenomegaly. Bowel sounds positive.  Musculoskeletal: no clubbing / cyanosis. No joint deformity upper and lower extremities. Good ROM, no contractures. Normal muscle tone.  Skin: no rashes, lesions, ulcers. No induration Neurologic: No obvious focal neurological deficits.  Cranial nerves on limited exam appeared normal.  Moving all 4 extremities.  Withdraws to pain and appears to have response to touch sensation as well. Psychiatric: Patient is confused, minimally verbal, only  says occasionally words, judgment and thought content is impaired.   Decubitus Ulcers: Not present on admission Catheters and tubes: None  Data Review:    Labs on Admission: I have personally reviewed following labs and imaging studies  CBC: Recent Labs  Lab 03/16/19 1646  WBC 10.1  HGB 13.2  HCT 40.8  MCV 98.6  PLT 0000000   Basic Metabolic Panel: Recent Labs  Lab 03/16/19 1646  NA 138  K 4.3  CL 105  CO2 22  GLUCOSE 174*  BUN 81*  CREATININE 2.07*  CALCIUM 9.4   GFR: Estimated Creatinine Clearance: 25.9 mL/min (A) (by C-G formula based on SCr of 2.07 mg/dL (H)). Liver Function Tests: No results for input(s): AST, ALT, ALKPHOS, BILITOT, PROT, ALBUMIN in the last 168 hours. No results for input(s): LIPASE, AMYLASE in the last 168 hours. No results for input(s): AMMONIA in the last 168 hours. Coagulation Profile: No results for input(s): INR, PROTIME in the last 168 hours. Cardiac Enzymes: No results for input(s): CKTOTAL, CKMB, CKMBINDEX, TROPONINI in the last 168 hours. BNP (last 3 results) No results for input(s): PROBNP in the last 8760 hours. HbA1C: No results for input(s): HGBA1C in the last 72 hours. CBG: Recent Labs  Lab 03/16/19 1638  GLUCAP 168*   Lipid Profile: No results for input(s): CHOL, HDL, LDLCALC, TRIG, CHOLHDL, LDLDIRECT in the last 72 hours. Thyroid Function Tests: No results for input(s): TSH, T4TOTAL, FREET4, T3FREE, THYROIDAB in the last 72 hours. Anemia Panel: No results for input(s): VITAMINB12, FOLATE, FERRITIN, TIBC, IRON, RETICCTPCT in the last 72 hours. Urine analysis:    Component Value Date/Time   COLORURINE YELLOW 03/16/2019 1552   APPEARANCEUR CLEAR 03/16/2019 1552   LABSPEC 1.020 03/16/2019 1552   PHURINE 5.0 03/16/2019 1552   GLUCOSEU NEGATIVE 03/16/2019 1552   HGBUR NEGATIVE 03/16/2019 1552   BILIRUBINUR NEGATIVE 03/16/2019 1552   KETONESUR NEGATIVE 03/16/2019 1552   PROTEINUR NEGATIVE 03/16/2019 1552    UROBILINOGEN 0.2 04/22/2010 1322   NITRITE NEGATIVE 03/16/2019 1552   LEUKOCYTESUR NEGATIVE 03/16/2019 1552     Imaging Results:      Radiological Exams on Admission: CT Head Wo Contrast  Result Date: 03/16/2019 CLINICAL DATA:  Right-sided weakness since 03/15/2019. EXAM: CT HEAD WITHOUT CONTRAST TECHNIQUE: Contiguous axial images were obtained from the base of the skull through the vertex without intravenous contrast. COMPARISON:  April 22, 2010 FINDINGS: Brain: No evidence of acute infarction, hemorrhage, hydrocephalus, extra-axial collection or mass lesion/mass effect. Atrophy and chronic microvascular ischemic changes are noted. Vascular: No hyperdense vessel or unexpected calcification. Skull:  Normal. Negative for fracture or focal lesion. Sinuses/Orbits: No acute finding. Other: None. IMPRESSION: 1. Motion degraded study. 2. No acute abnormality. 3. Atrophy and chronic microvascular ischemic changes are noted. Electronically Signed   By: Constance Holster M.D.   On: 03/16/2019 18:15      Adhya Cocco Ginette Otto MD [facility]  If 7PM-7AM, please contact night-coverage   03/16/2019, 8:49 PM

## 2019-03-16 NOTE — ED Triage Notes (Signed)
Patient developed R sided weakness yesterday. Has history of dementia and is unable to communicate. Sister reports patient is bedridden and family is unable to care for her.

## 2019-03-17 ENCOUNTER — Inpatient Hospital Stay (HOSPITAL_COMMUNITY): Payer: Medicare Other

## 2019-03-17 DIAGNOSIS — I351 Nonrheumatic aortic (valve) insufficiency: Secondary | ICD-10-CM

## 2019-03-17 DIAGNOSIS — R410 Disorientation, unspecified: Secondary | ICD-10-CM

## 2019-03-17 LAB — BLOOD GAS, VENOUS
Acid-base deficit: 1.8 mmol/L (ref 0.0–2.0)
Bicarbonate: 22.4 mmol/L (ref 20.0–28.0)
FIO2: 21
O2 Saturation: 78.9 %
Patient temperature: 37
pCO2, Ven: 42 mmHg — ABNORMAL LOW (ref 44.0–60.0)
pH, Ven: 7.357 (ref 7.250–7.430)
pO2, Ven: 44.9 mmHg (ref 32.0–45.0)

## 2019-03-17 LAB — ECHOCARDIOGRAM COMPLETE
Height: 66 in
Weight: 2479.73 oz

## 2019-03-17 LAB — LIPID PANEL
Cholesterol: 143 mg/dL (ref 0–200)
HDL: 38 mg/dL — ABNORMAL LOW (ref >40)
LDL Cholesterol: 84 mg/dL (ref 0–99)
Total CHOL/HDL Ratio: 3.8 RATIO
Triglycerides: 104 mg/dL (ref <150)
VLDL: 21 mg/dL (ref 0–40)

## 2019-03-17 LAB — PROCALCITONIN: Procalcitonin: 0.27 ng/mL

## 2019-03-17 LAB — VITAMIN B12: Vitamin B-12: 146 pg/mL — ABNORMAL LOW (ref 180–914)

## 2019-03-17 LAB — TSH: TSH: 0.351 u[IU]/mL (ref 0.350–4.500)

## 2019-03-17 LAB — HIV ANTIBODY (ROUTINE TESTING W REFLEX): HIV Screen 4th Generation wRfx: NONREACTIVE

## 2019-03-17 LAB — HEMOGLOBIN A1C
Hgb A1c MFr Bld: 5.2 % (ref 4.8–5.6)
Mean Plasma Glucose: 102.54 mg/dL

## 2019-03-17 LAB — SARS CORONAVIRUS 2 (TAT 6-24 HRS): SARS Coronavirus 2: NEGATIVE

## 2019-03-17 LAB — AMMONIA: Ammonia: 27 umol/L (ref 9–35)

## 2019-03-17 MED ORDER — HALOPERIDOL 2 MG PO TABS: 2.0000 mg | ORAL_TABLET | Freq: Four times a day (QID) | ORAL | Status: DC | PRN

## 2019-03-17 MED ORDER — HALOPERIDOL LACTATE 5 MG/ML IJ SOLN
2.0000 mg | Freq: Four times a day (QID) | INTRAMUSCULAR | Status: DC | PRN
  Administered 2019-03-17: 15:00:00 2 mg via INTRAVENOUS
  Filled 2019-03-17: qty 1

## 2019-03-17 MED ORDER — PIPERACILLIN-TAZOBACTAM 3.375 G IVPB
3.3750 g | Freq: Once | INTRAVENOUS | Status: AC
  Administered 2019-03-17: 3.375 g via INTRAVENOUS
  Filled 2019-03-17: qty 50

## 2019-03-17 MED ORDER — LACTATED RINGERS IV SOLN: INTRAVENOUS | Status: DC

## 2019-03-17 MED ORDER — ENSURE ENLIVE PO LIQD
237.0000 mL | Freq: Two times a day (BID) | ORAL | Status: DC
  Administered 2019-03-18 – 2019-03-20 (×4): 237 mL via ORAL

## 2019-03-17 MED ORDER — PIPERACILLIN-TAZOBACTAM 3.375 G IVPB
3.3750 g | Freq: Three times a day (TID) | INTRAVENOUS | Status: DC
  Administered 2019-03-17 – 2019-03-19 (×5): 3.375 g via INTRAVENOUS
  Filled 2019-03-17 (×6): qty 50

## 2019-03-17 NOTE — Evaluation (Signed)
Speech Language Pathology Evaluation Patient Details Name: CARMENLITA MALLORY MRN: [id number] DOB: 10-22-1950 Today's Date: 03/17/2019 Time: [id number] SLP Time Calculation (min) (ACUTE ONLY): 24 min  Problem List:  Patient Active Problem List   Diagnosis Date Noted  . Dementia ([place]) 03/16/2019  . Renal failure 03/16/2019  . Altered mental status 03/16/2019  . CVA (cerebral vascular accident) ([place]) 03/16/2019  . AMS (altered mental status) 03/16/2019  . Personal history of colonic polyps 10/23/2009  . GERD 09/05/2009  . Irritable bowel syndrome 09/05/2009  . DIARRHEA 09/05/2009  . ABDOMINAL PAIN, UNSPECIFIED SITE 09/05/2009  . [place] DISEASE, LUMBAR SPINE 02/02/2008  . FOOT SPRAIN 08/31/2007  . OSTEOARTHRITIS, LOWER LEG 12/30/2006  . BASILAR JOINT ARTHRITIS 12/30/2006  . Backache 12/30/2006  . HIGH BLOOD PRESSURE 12/29/2006   Past Medical History:  Past Medical History:  Diagnosis Date  . Alzheimer's dementia ([place])   . Renal disorder    Past Surgical History:  Past Surgical History:  Procedure Laterality Date  . COLONOSCOPY WITH PROPOFOL N/A 06/02/2016   Procedure: COLONOSCOPY WITH PROPOFOL;  Surgeon: Garlan Fair, MD;  Location: [facility];  Service: Endoscopy;  Laterality: N/A;  . REPLACEMENT TOTAL KNEE     HPI:  Edeline L. Maricle is a 69 y/o female who experienced a significant change in mental status over the last 48-72 hrs prior to admission in the form of increased fatigue, decreased PO intake, R sided weakness and dysphagia. CT shows no acute findings. Pt has underlying Alzheimer's dementia which has worsened over the last several months. Patient sister usually feeds on with protein shakes at home due to decreased p.o. intake in general but constant she has been choking on the protein shakes over the last several days.   Assessment / Plan / Recommendation Clinical Impression  Speech Language evaluation completed while Pt was sitting upright in chair; Pt  verbally responded appropriately upon SLP entering the room, she reported her name and her birthday when asked. Pt's sister (primary caregiver) was present for evaluation and was pleasantly surprised at Pt's ability to report her name and birthday - she states she has been unable to do this for the past few days/weeks. Pt was unable to answer any other questions or name items as speech is confabulated and filled with inaccurate substitutions and jargon; this is baseline for Pt per sister. Pt was pleasant and cooperative; Utilizing the Global Deterioration Scale (GDS), Pt is likely a 6 (moderately severe dementia) (GDS is a scale of 1-7; 7 being most severe). According to Pt's caregiver, she is more alert today and if anything is cognitively improved from baseline. SLP provided education of cognitive strategies. There are no further ST needs for cognition at this time, thank you.        Follow Up Recommendations  None          SLP Evaluation Cognition  Overall Cognitive Status: History of cognitive impairments - at baseline Arousal/Alertness: Awake/alert Orientation Level: Oriented to person;Disoriented to place;Disoriented to situation       Comprehension  Auditory Comprehension Overall Auditory Comprehension: Impaired at baseline Visual Recognition/Discrimination Discrimination: Not tested Reading Comprehension Reading Status: Not tested    Expression Expression Primary Mode of Expression: Verbal Verbal Expression Overall Verbal Expression: Impaired at baseline   Oral / Motor      Motor Speech Overall Motor Speech: Appears within functional limits for tasks assessed   Neiva Maenza H. Roddie Mc, [place]  Della Goo 03/17/2019, 11:29 AM

## 2019-03-17 NOTE — NC FL2 (Signed)
[place] MEDICAID FL2 LEVEL OF CARE SCREENING TOOL     IDENTIFICATION  Patient Name: Shirley [name] Birthdate: 08-15-1950 Sex: female Admission Date (Current Location): 03/16/2019  [place] and [place] Number:  [employer] and Address:  [facility] [address], [place]      Provider Number: [id number]  Attending Physician Name and Address:  Rodena Goldmann, DO  Relative Name and Phone Number:  [name]    Current Level of Care: Hospital Recommended Level of Care: [place] Prior Approval Number:    Date Approved/Denied:   PASRR Number:    Discharge Plan: SNF    Current Diagnoses: Patient Active Problem List   Diagnosis Date Noted  . Dementia ([place]) 03/16/2019  . Renal failure 03/16/2019  . Altered mental status 03/16/2019  . CVA (cerebral vascular accident) ([place]) 03/16/2019  . AMS (altered mental status) 03/16/2019  . Personal history of colonic polyps 10/23/2009  . GERD 09/05/2009  . Irritable bowel syndrome 09/05/2009  . DIARRHEA 09/05/2009  . ABDOMINAL PAIN, UNSPECIFIED SITE 09/05/2009  . [place] DISEASE, LUMBAR SPINE 02/02/2008  . FOOT SPRAIN 08/31/2007  . OSTEOARTHRITIS, LOWER LEG 12/30/2006  . BASILAR JOINT ARTHRITIS 12/30/2006  . Backache 12/30/2006  . HIGH BLOOD PRESSURE 12/29/2006    Orientation RESPIRATION BLADDER Height & Weight     Self  Normal External catheter Weight: 70.3 kg Height:  5\' 6"  (167.6 cm)  BEHAVIORAL SYMPTOMS/MOOD NEUROLOGICAL BOWEL NUTRITION STATUS      Continent Diet(see DC summary)  AMBULATORY STATUS COMMUNICATION OF NEEDS Skin   Extensive Assist Verbally Normal                       Personal Care Assistance Level of Assistance  Bathing, Feeding, Dressing Bathing Assistance: Maximum assistance Feeding assistance: Limited assistance Dressing Assistance: Maximum assistance     Functional Limitations Info  Sight, Speech, Hearing Sight Info:  Adequate Hearing Info: Adequate Speech Info: Adequate    SPECIAL CARE FACTORS FREQUENCY  PT (By licensed PT)     PT Frequency: 5x/week              Contractures Contractures Info: Not present    Additional Factors Info  Code Status, Allergies, Psychotropic Code Status Info: DNR Allergies Info: LITHIUM; ENALAPIRL; MORPHINE; PREDNISON; BUSPIRONE; CELECOXIB; CEPHALLEXIN; TYLENOL; ROFECOXIB Psychotropic Info: SEROQUEL         Current Medications (03/17/2019):  This is the current hospital active medication list Current Facility-Administered Medications  Medication Dose Route Frequency Provider Last Rate Last Admin  . acetaminophen (TYLENOL) tablet 650 mg  650 mg Oral Q4H PRN Lynetta Mare, MD       Or  . acetaminophen (TYLENOL) 160 MG/5ML solution 650 mg  650 mg Per Tube Q4H PRN Lynetta Mare, MD       Or  . acetaminophen (TYLENOL) suppository 650 mg  650 mg Rectal Q4H PRN Lynetta Mare, MD      . aspirin chewable tablet 81 mg  81 mg Oral Daily Lynetta Mare, MD   81 mg at 03/17/19 1112  . divalproex (DEPAKOTE) DR tablet 125 mg  125 mg Oral BID Lynetta Mare, MD   125 mg at 03/17/19 1112  . heparin injection 5,000 Units  5,000 Units Subcutaneous Q8H Lynetta Mare, MD   5,000 Units at 03/17/19 0559  . hyoscyamine (LEVBID) 0.375 MG 12 hr tablet 0.375 mg  0.375 mg Oral Once Lovey Newcomer  T, NP      . lactated ringers infusion   Intravenous Continuous Manuella Ghazi, Pratik D, DO      . piperacillin-tazobactam (ZOSYN) IVPB 3.375 g  3.375 g Intravenous Once Manuella Ghazi, Pratik D, DO      . piperacillin-tazobactam (ZOSYN) IVPB 3.375 g  3.375 g Intravenous Q8H Shah, Pratik D, DO      . senna-docusate (Senokot-S) tablet 1 tablet  1 tablet Oral QHS PRN Lynetta Mare, MD      . sodium chloride flush (NS) 0.9 % injection 3 mL  3 mL Intravenous Once Lynetta Mare, MD         Discharge Medications: Please see discharge summary for a list of discharge  medications.  Relevant Imaging Results:  Relevant Lab Results:   Additional Information SSN [id number]  Vanassa Penniman, Chauncey Reading, RN

## 2019-03-17 NOTE — Progress Notes (Addendum)
PROGRESS NOTE    Shirley [name]  [id number] DOB: 03-24-1950 DOA: 03/16/2019 PCP: Wenda Low, MD   Brief Narrative:  Per HPI:  Shirley [name] is a 69 y.o. female with medical history significant of Alzheimer's dementia who presented to the ER with worsening altered mental status. History has been obtained from the ER record and the family member as the patient has altered mental status and baseline dementia and is unable to provide history. Patient was diagnosed with dementia about 8 years ago.  She has had progressively worsened dementia over the last year and over last 3 months her symptoms have gotten worse with increased confusion, decreased PO intake, increased physical deconditioning.  Patient's sister reports she has noticed that the patient was using her right arm less than usual over the last 2 to 3 days.  She went to see the patient's primary care provider who sent her in for evaluation of possible stroke. Patient sister usually feeds on with protein shakes at home due to decreased p.o. intake in general but constant she has been choking on the protein shakes over the last several days. No fever, chills, shortness of breath, nausea, vomiting, abdominal pain, diarrhea reported.  Sister has not noticed patient deviation but has noticed weakness use of the right upper extremity as noted above.  2/26: Patient was admitted with acute encephalopathy in the setting of Alzheimer's dementia and has been noted to have decreased p.o. intake.  She was noted to be dehydrated and was given IV fluid.  She also has AKI.  Work-up thus far has been unrevealing from a neurological standpoint with a brain MRI demonstrating no acute CVA.  She does appear to have some hazy infiltrates to the right side of her lung suspicious for aspiration especially given her poor SLP evaluation.  I will start her on IV Zosyn given some procalcitonin elevation and continue to monitor for ongoing improvement.  PT  recommending SNF on discharge which sister is agreeable to.  Assessment & Plan:   Principal Problem:   Altered mental status Active Problems:   GERD   Irritable bowel syndrome   OSTEOARTHRITIS, LOWER LEG   DEGENERATIVE DISC DISEASE, LUMBAR SPINE   Backache   Dementia (HCC)   Renal failure   CVA (cerebral vascular accident) ([place])   AMS (altered mental status)   Acute encephalopathy likely metabolic-multifactorial -I believe there is a combination of dehydration with AKI and aspiration pneumonia contributing to this process -She is not currently at her baseline level of mentation as confirmed by her sister on phone -There may also be progression of Alzheimer's dementia -Brain MRI negative for CVA -Continue to monitor with treatment on IV fluid as well as Zosyn  Suspected aspiration pneumonia -Continue on IV Zosyn empirically for now -Monitor procalcitonin and repeat CBC  AKI -Uncertain baseline creatinine with prior in 2012.  Doubling of levels noted on admission -Likely prerenal with dehydration -Continue IV fluid resuscitation and monitor repeat labs in a.m.  Alzheimer's dementia -Continue Depakote -Anticipate discharge to SNF/rehab per PT recommendations -Likely need for hospice soon if she does not demonstrate any significant improvement  DVT prophylaxis: Heparin Code Status: DNR Family Communication: Discussed with sister on phone Disposition Plan: Continue treatment for medical optimization on IV fluid as well as Zosyn with anticipated discharge to rehab once improved.   Consultants:   None  Procedures:   See below  Antimicrobials:  Anti-infectives (From admission, onward)   None       Subjective:  Patient seen and evaluated today with no new acute complaints or concerns. No acute concerns or events noted overnight.  She appears quite confused and not currently at baseline per sister on phone.  Brain MRI negative for any acute  findings.  Objective: Vitals:   03/17/19 0051 03/17/19 0255 03/17/19 0450 03/17/19 0600  BP: 133/64 (!) 160/90 (!) 167/97 135/82  Pulse: 94 92 88 80  Resp: 18     Temp: 98.5 F (36.9 C) 98 F (36.7 C) 98.8 F (37.1 C) 98.1 F (36.7 C)  TempSrc:  Axillary    SpO2: 98% 99% 100% 95%  Weight:      Height:        Intake/Output Summary (Last 24 hours) at 03/17/2019 1158 Last data filed at 03/17/2019 0534 Gross per 24 hour  Intake 240 ml  Output 450 ml  Net -210 ml   Filed Weights   03/16/19 1549 03/16/19 2248  Weight: 74.8 kg 70.3 kg    Examination:  General exam: Appears calm and comfortable  Respiratory system: Clear to auscultation. Respiratory effort normal. Cardiovascular system: S1 & S2 heard, RRR. No JVD, murmurs, rubs, gallops or clicks. No pedal edema. Gastrointestinal system: Abdomen is nondistended, soft and nontender. No organomegaly or masses felt. Normal bowel sounds heard. Central nervous system: Alert and awake Extremities: No edema Skin: No rashes, lesions or ulcers Psychiatry: Difficult to assess    Data Reviewed: I have personally reviewed following labs and imaging studies  CBC: Recent Labs  Lab 03/16/19 1646  WBC 10.1  HGB 13.2  HCT 40.8  MCV 98.6  PLT 0000000   Basic Metabolic Panel: Recent Labs  Lab 03/16/19 1646  NA 138  K 4.3  CL 105  CO2 22  GLUCOSE 174*  BUN 81*  CREATININE 2.07*  CALCIUM 9.4   GFR: Estimated Creatinine Clearance: 25 mL/min (A) (by C-G formula based on SCr of 2.07 mg/dL (H)). Liver Function Tests: No results for input(s): AST, ALT, ALKPHOS, BILITOT, PROT, ALBUMIN in the last 168 hours. No results for input(s): LIPASE, AMYLASE in the last 168 hours. No results for input(s): AMMONIA in the last 168 hours. Coagulation Profile: No results for input(s): INR, PROTIME in the last 168 hours. Cardiac Enzymes: No results for input(s): CKTOTAL, CKMB, CKMBINDEX, TROPONINI in the last 168 hours. BNP (last 3 results) No  results for input(s): PROBNP in the last 8760 hours. HbA1C: Recent Labs    03/17/19 0421  HGBA1C 5.2   CBG: Recent Labs  Lab 03/16/19 1638  GLUCAP 168*   Lipid Profile: Recent Labs    03/17/19 0421  CHOL 143  HDL 38*  LDLCALC 84  TRIG 104  CHOLHDL 3.8   Thyroid Function Tests: Recent Labs    03/17/19 0421  TSH 0.351   Anemia Panel: Recent Labs    03/17/19 0421  VITAMINB12 146*   Sepsis Labs: Recent Labs  Lab 03/17/19 1008  PROCALCITON 0.27    No results found for this or any previous visit (from the past 240 hour(s)).       Radiology Studies: CT Head Wo Contrast  Result Date: 03/16/2019 CLINICAL DATA:  Right-sided weakness since 03/15/2019. EXAM: CT HEAD WITHOUT CONTRAST TECHNIQUE: Contiguous axial images were obtained from the base of the skull through the vertex without intravenous contrast. COMPARISON:  April 22, 2010 FINDINGS: Brain: No evidence of acute infarction, hemorrhage, hydrocephalus, extra-axial collection or mass lesion/mass effect. Atrophy and chronic microvascular ischemic changes are noted. Vascular: No hyperdense vessel or unexpected  calcification. Skull: Normal. Negative for fracture or focal lesion. Sinuses/Orbits: No acute finding. Other: None. IMPRESSION: 1. Motion degraded study. 2. No acute abnormality. 3. Atrophy and chronic microvascular ischemic changes are noted. Electronically Signed   By: Constance Holster M.D.   On: 03/16/2019 18:15   MR BRAIN WO CONTRAST  Result Date: 03/17/2019 CLINICAL DATA:  Mental status change.  Neuro deficit. EXAM: MRI HEAD WITHOUT CONTRAST TECHNIQUE: Multiplanar, multiecho pulse sequences of the brain and surrounding structures were obtained without intravenous contrast. COMPARISON:  CT head 03/16/2019 FINDINGS: Incomplete study. Images significantly degraded by motion. The patient was not able to complete the study Negative for acute infarct on motion degraded diffusion weighted imaging. Generalized  atrophy. Periventricular white matter hyperintensity most likely due to chronic ischemia. No mass or midline shift. IMPRESSION: Negative for acute infarct Incomplete study which is degraded by significant motion. Electronically Signed   By: Franchot Gallo M.D.   On: 03/17/2019 08:51   DG Chest Port 1 View  Result Date: 03/16/2019 CLINICAL DATA:  Initial evaluation for possible aspiration. EXAM: PORTABLE CHEST 1 VIEW COMPARISON:  Prior radiograph from 02/11/2005. FINDINGS: Patient is rotated to the right. Mild cardiomegaly, similar to previous. Smoothly margined opacity along the right paratracheal stripe favored to reflect mediastinal vasculature related to patient rotation. Lungs mildly hypoinflated. Mild linear atelectatic changes noted within the retrocardiac left lower lobe. Asymmetric patchy and hazy opacity at the right lung base favored to reflect atelectasis and/or prominent vasculature related to patient rotation. Possible infiltrate could be considered in the correct clinical setting. No edema or effusion. No pneumothorax. No acute osseous finding. IMPRESSION: 1. Asymmetric patchy and hazy opacity at the right lung base. Atelectasis and/or asymmetric prominence of the pulmonary markings is favored given that the patient is rotated to the right. Possible infiltrate difficult to exclude, and could be considered in the correct clinical setting. 2. Mild linear atelectatic changes within the retrocardiac left lower lobe. Electronically Signed   By: Jeannine Boga M.D.   On: 03/16/2019 22:52        Scheduled Meds: . aspirin  81 mg Oral Daily  . divalproex  125 mg Oral BID  . heparin  5,000 Units Subcutaneous Q8H  . hyoscyamine  0.375 mg Oral Once  . sodium chloride flush  3 mL Intravenous Once   Continuous Infusions: . lactated ringers       LOS: 1 day    Time spent: 30 minutes    Ikran Patman Darleen Crocker, DO [facility] Pager [phone]  If 7PM-7AM, please contact  night-coverage [email or web address] Password [facility] 03/17/2019, 11:58 AM

## 2019-03-17 NOTE — Progress Notes (Signed)
*  PRELIMINARY RESULTS* Echocardiogram 2D Echocardiogram has been performed.  Leavy Cella 03/17/2019, 3:59 PM

## 2019-03-17 NOTE — Progress Notes (Signed)
Initial Nutrition Assessment  DOCUMENTATION CODES:   Not applicable  INTERVENTION:  -Ensure Enlive po BID, each supplement provides 350 kcal and 20 grams of protein  -Magic cup TID with meals, each supplement provides 290 kcal and 9 grams of protein  -High risk for refeeding,recommend monitoring magnesium, potassium, and phosphorus daily, MD to replete as needed  NUTRITION DIAGNOSIS:   Inadequate oral intake related to lethargy/confusion(Alzheimer's dementia) as evidenced by per patient/family report(per chart, family reports progressive decreased po intake over the past 3 months).   GOAL:   Patient will meet greater than or equal to 90% of their needs   MONITOR:   Labs, I & O's, Supplement acceptance, PO intake, Weight trends  REASON FOR ASSESSMENT:   Malnutrition Screening Tool    ASSESSMENT:  RD working remotely.  69 year old female with past medical history significant of Alzheimer's dementia presented to ED with significant change in mental status over the last 48-72 hrs, noted increased fatigue, decreased po intake, dysphagia, and right sided weakness.  Patient admitted for acute encephalopathy in the setting of Alzheimer's dementia.   Per chart review, patient diagnosed with dementia ~ 8 years ago with progressively worsened dementia over the past 3 months. Sister of patient reports usually feeds patient protein shakes at home due to decreased po intake and reports that patient has been choking on them the past several days.   Patient evaluated by SLP who recommended dysphagia 2; thin liquids with 1:1 feeder assist. No documented meals at this time for review. Will continue to monitor for po intake of meals and provide Ensure and Magic Cup supplements to aid with estimated needs. Given report of ongoing poor po intake, patient is at risk for refeeding. Recommend monitoring K, Mg, and P during admission.   Per notes: -brain MRI negative for CVA -suspected aspiration  pneumonia -AKI likely prerenal with dehydration -anticipated discharge to SNF per PT recommendations -possible need for hospice without demonstration of any significant improvement.  Mild pitting BLE edema noted per RN assessment.  Current wt 154.66 lbs No recent wt history for review, last wt prior to admission 82.6 kg (181.72 lbs) in 2018  Given pt history of Alzheimer's dementia and reported decreased po intake, highly suspect malnutrition, however unable to identify at this time without wt history, NFPE and/or dietary recall. Supplements will be provided, physical exam to be completed at follow-up.  Medications reviewed   Labs: CBG 168, BUN 81 (H), Cr 2.07 (H) K 4.3 (WNL)  NUTRITION - FOCUSED PHYSICAL EXAM: Unable to complete at this time, RD working remotely.   Diet Order:   Diet Order            DIET DYS 2 Room service appropriate? Yes; Fluid consistency: Thin  Diet effective now              EDUCATION NEEDS:   Not appropriate for education at this time  Skin:  Skin Assessment: Reviewed RN Assessment  Last BM:  pta  Height:   Ht Readings from Last 1 Encounters:  03/16/19 5\' 6"  (1.676 m)    Weight:   Wt Readings from Last 1 Encounters:  03/16/19 70.3 kg    Ideal Body Weight:  59.1 kg  BMI:  Body mass index is 25.01 kg/m.  Estimated Nutritional Needs:   Kcal:  [id number]  Protein:  88-98  Fluid:  >/= 1.7 L/day   Lajuan Lines, RD, LDN Clinical Nutrition After Hours/Weekend Pager # in [place]

## 2019-03-17 NOTE — Evaluation (Signed)
Physical Therapy Evaluation Patient Details Name: Shirley [name] MRN: [id number] DOB: [birth year] Today's Date: 03/17/2019   History of Present Illness  Shirley [name] is a 69 y.o. female with medical history significant of Alzheimer's dementia who presented to the ER with worsening altered mental status.History has been obtained from the ER record and the family member as the patient has altered mental status and baseline dementia and is unable to provide history.Patient was diagnosed with dementia about 8 years ago. She has had progressively worsened dementia over the last year and over last 3 months her symptoms have gotten worse with increased confusion, decreased PO intake, increased physical deconditioning. Patient's sister reports she has noticed that the patient was using her right arm less than usual over the last 2 to 3 days.  She went to see the patient's primary care provider who sent her in for evaluation of possible stroke.Patient sister usually feeds on with protein shakes at home due to decreased p.o. intake in general but constant she has been choking on the protein shakes over the last several days.No fever, chills, shortness of breath, nausea, vomiting, abdominal pain, diarrhea reported.  Sister has not noticed patient deviation but has noticed weakness use of the right upper extremity as noted above.    Clinical Impression  Patient limited for functional mobility as stated below secondary to BLE weakness, fatigue and poor standing balance. Patient with c/o right leg pain throughout session. She requires frequent verbal and tactile cueing with limited carry over due to cognition. She is able to transition to seated EOB with mod assist for upright trunk and LE movements. She is able stand and ambulate with use of RW and assist along with frequent cueing. Patient ends session in chair with chair alarm set - RN aware.  Patient will benefit from continued physical therapy in hospital and  recommended venue below to increase strength, balance, endurance for safe ADLs and gait.     Follow Up Recommendations SNF    Equipment Recommendations  None recommended by PT    Recommendations for Other Services       Precautions / Restrictions Precautions Precautions: Fall Restrictions Weight Bearing Restrictions: No      Mobility  Bed Mobility Overal bed mobility: Needs Assistance Bed Mobility: Supine to Sit     Supine to sit: Mod assist;HOB elevated     General bed mobility comments: requires frequent verbal cueing with limited carry over to transition to seated EOB. Requires assist to upright trunk and for LE movement. Requires assist to remain seated for several minutes before she is able to sit without external support  Transfers Overall transfer level: Needs assistance Equipment used: Rolling walker (2 wheeled) Transfers: Sit to/from [facility] Sit to Stand: Mod assist Stand pivot transfers: Mod assist       General transfer comment: requires frequent verbal cueing with limited carry over to transition to standing with use of RW and mod assist  Ambulation/Gait Ambulation/Gait assistance: Mod assist Gait Distance (Feet): 4 Feet Assistive device: Rolling walker (2 wheeled) Gait Pattern/deviations: Shuffle;Decreased step length - right;Decreased step length - left Gait velocity: decreased   General Gait Details: short, labored steps at bedside to ambulate to chair, frequent verbal and tactile cueing for sequencing with limited carry over, assist for balance and safety  Stairs            Wheelchair Mobility    Modified Rankin (Stroke Patients Only)       Balance Overall balance  assessment: Needs assistance Sitting-balance support: Feet supported;Bilateral upper extremity supported Sitting balance-Leahy Scale: Fair Sitting balance - Comments: seated EOB   Standing balance support: Bilateral upper extremity supported Standing  balance-Leahy Scale: Fair Standing balance comment: using RW                             Pertinent Vitals/Pain Pain Assessment: Faces Faces Pain Scale: Hurts even more Pain Location: R leg Pain Intervention(s): Limited activity within patient's tolerance;Monitored during session    [place] expects to be discharged to:: Private residence Living Arrangements: Other relatives Available Help at Discharge: Family;Available PRN/intermittently Type of Home: House Home Access: Stairs to enter   [employer] of Steps: 1-2 Home Layout: One level Home Equipment: None Additional Comments: Patient appears to be a poor historian of PLOF and home set up/equipment    Prior Function           Comments: Patient appears to be a poor historian of PLOF and home set up/equipment, patient states she was independent without AD     Hand Dominance        Extremity/Trunk Assessment   Upper Extremity Assessment Upper Extremity Assessment: Generalized weakness    Lower Extremity Assessment Lower Extremity Assessment: Generalized weakness;Difficult to assess due to impaired cognition    Cervical / Trunk Assessment Cervical / Trunk Assessment: Normal  Communication   Communication: Expressive difficulties  Cognition Arousal/Alertness: Awake/alert Behavior During Therapy: Flat affect Overall Cognitive Status: No family/caregiver present to determine baseline cognitive functioning                                 General Comments: Patient appears to be poor historian, has difficulty finding words, limited carry over with verbal/tactile cueing      General Comments      Exercises     Assessment/Plan    PT Assessment Patient needs continued PT services  PT Problem List Decreased strength;Decreased safety awareness;Decreased mobility;Decreased activity tolerance;Decreased cognition;Decreased balance;Decreased knowledge of use of  DME;Pain       PT Treatment Interventions DME instruction;Therapeutic exercise;Gait training;Balance training;Neuromuscular re-education;Stair training;Functional mobility training;Therapeutic activities;Patient/family education    PT Goals (Current goals can be found in the Care Plan section)  Acute Rehab PT Goals Patient Stated Goal: return home PT Goal Formulation: With patient Time For Goal Achievement: 03/31/19 Potential to Achieve Goals: Fair    Frequency Min 3X/week   Barriers to discharge        Co-evaluation               AM-PAC PT "6 Clicks" Mobility  Outcome Measure Help needed turning from your back to your side while in a flat bed without using bedrails?: A Little Help needed moving from lying on your back to sitting on the side of a flat bed without using bedrails?: A Little Help needed moving to and from a bed to a chair (including a wheelchair)?: A Lot Help needed standing up from a chair using your arms (e.g., wheelchair or bedside chair)?: A Little Help needed to walk in hospital room?: A Lot Help needed climbing 3-5 steps with a railing? : A Lot 6 Click Score: 15    End of Session Equipment Utilized During Treatment: Gait belt Activity Tolerance: Patient tolerated treatment well;Patient limited by fatigue;Patient limited by pain Patient left: in chair;with chair alarm set;with call bell/phone within reach  Nurse Communication: Mobility status PT Visit Diagnosis: Unsteadiness on feet (R26.81);Other abnormalities of gait and mobility (R26.89);Muscle weakness (generalized) (M62.81)    Time: [id number] PT Time Calculation (min) (ACUTE ONLY): 32 min   Charges:   PT Evaluation $PT Eval Moderate Complexity: 1 Mod PT Treatments $Therapeutic Activity: 23-37 mins        10:29 AM, 03/17/19 Mearl Latin PT, DPT Physical Therapist at [facility]

## 2019-03-17 NOTE — Plan of Care (Signed)
  Problem: Acute Rehab PT Goals(only PT should resolve) Goal: Pt Will Go Supine/Side To Sit Outcome: Progressing Flowsheets (Taken 03/17/2019 1030) Pt will go Supine/Side to Sit: with minimal assist Goal: Pt Will Go Sit To Supine/Side Outcome: Progressing Flowsheets (Taken 03/17/2019 1030) Pt will go Sit to Supine/Side: with minimal assist Goal: Patient Will Transfer Sit To/From Stand Outcome: Progressing Flowsheets (Taken 03/17/2019 1030) Patient will transfer sit to/from stand: with minimal assist Goal: Pt Will Transfer Bed To Chair/Chair To Bed Outcome: Progressing Flowsheets (Taken 03/17/2019 1030) Pt will Transfer Bed to Chair/Chair to Bed: with min assist Goal: Pt Will Ambulate Outcome: Progressing Flowsheets (Taken 03/17/2019 1030) Pt will Ambulate:  25 feet  with minimal assist  with least restrictive assistive device  10:32 AM, 03/17/19 Mearl Latin PT, DPT Physical Therapist at [facility]

## 2019-03-17 NOTE — Progress Notes (Signed)
Pharmacy Antibiotic Note  Shirley [name] is a 69 y.o. female admitted on 03/16/2019 with aspriation pneumonia .  Pharmacy has been consulted for Zosyn dosing.  Plan: Zosyn 3.375g IV q8h (4 hour infusion).  Monitor labs, c/s, and patient improvement.  Height: 5\' 6"  (167.6 cm) Weight: 154 lb 15.7 oz (70.3 kg) IBW/kg (Calculated) : 59.3  Temp (24hrs), Avg:98.3 F (36.8 C), Min:97.6 F (36.4 C), Max:98.8 F (37.1 C)  Recent Labs  Lab 03/16/19 1646  WBC 10.1  CREATININE 2.07*    Estimated Creatinine Clearance: 25 mL/min (A) (by C-G formula based on SCr of 2.07 mg/dL (H)).    Allergies  Allergen Reactions  . Morphine And Related Anaphylaxis  . Buspirone Hcl Other (See Comments)    Unknown   . Celecoxib Other (See Comments)    Unknown   . Cephalexin Other (See Comments)    Unknown   . Enalapril Maleate Other (See Comments)    Unknown   . Lithium   . Prednisone Other (See Comments)    Unknown   . Rofecoxib Other (See Comments)    Vioxx- Unknown   . Tylenol [Acetaminophen] Other (See Comments)    Unknown     Antimicrobials this admission: Zosyn 2/26 >>     Dose adjustments this admission: N/A   Thank you for allowing pharmacy to be a part of this patient's care.  Ramond Craver 03/17/2019 1:56 PM

## 2019-03-17 NOTE — Evaluation (Signed)
Clinical/Bedside Swallow Evaluation Patient Details  Name: Shirley [name] MRN: [id number] Date of Birth: [birth year]  Today's Date: 03/17/2019 Time: SLP Start Time (ACUTE ONLY): 1032 SLP Stop Time (ACUTE ONLY): 1046 SLP Time Calculation (min) (ACUTE ONLY): 14 min  Past Medical History:  Past Medical History:  Diagnosis Date  . Alzheimer's dementia ([place])   . Renal disorder    Past Surgical History:  Past Surgical History:  Procedure Laterality Date  . COLONOSCOPY WITH PROPOFOL N/A 06/02/2016   Procedure: COLONOSCOPY WITH PROPOFOL;  Surgeon: Garlan Fair, MD;  Location: [facility];  Service: Endoscopy;  Laterality: N/A;  . REPLACEMENT TOTAL KNEE     HPI:  Shirley [name] is a 69 y/o female who experienced a significant change in mental status over the last 48-72 hrs prior to admission in the form of increased fatigue, decreased PO intake, R sided weakness and dysphagia. CT shows no acute findings. Pt has underlying Alzheimer's dementia which has worsened over the last several months. Patient sister usually feeds on with protein shakes at home due to decreased p.o. intake in general but constant she has been choking on the protein shakes over the last several days.   Assessment / Plan / Recommendation Clinical Impression  Clinical Swallowing evaluation completed while Pt was sitting upright in chair; Pt consumed thin liquids, puree and solid textures without overt s/sx of oropharyngeal dysphagia. Pt's caregiver, Shirley [name], sister was present for evalution reporting Pt has been more recently getting "choked" on solid textures at home. Pt reportedly has difficulty with breads and textures that are not soft. SLP provided education that Pt should only consume soft textures and that textures that seem to cause problems should be avoided. Recommend downgrade Pt's diet to D2/fine chop and continue with thin liquids. Meds to be adminsitered whole with liquid. SLP reviewed universal aspiration  precautions with Pt's daughter and recommended diet downgrade. There are no further ST needs for dysphagia at this time, ST to sign off.  Secondary to Pt's cognitive status (moderately severe dementia) Pt requires 1:1 feeder assist and support for self feeding. SLP Visit Diagnosis: Dysphagia, unspecified (R13.10)    Aspiration Risk  Mild aspiration risk    Diet Recommendation Dysphagia 2 (Fine chop);Thin liquid   Liquid Administration via: Straw;Cup Medication Administration: Whole meds with liquid Supervision: Full supervision/cueing for compensatory strategies Compensations: Minimize environmental distractions;Slow rate;Small sips/bites;Follow solids with liquid Postural Changes: Remain upright for at least 30 minutes after po intake;Seated upright at 90 degrees    Other  Recommendations Oral Care Recommendations: Oral care BID        Swallow Study    General HPI: Shirley [name] is a 69 y/o female who experienced a significant change in mental status over the last 48-72 hrs prior to admission in the form of increased fatigue, decreased PO intake, R sided weakness and dysphagia. CT shows no acute findings. Pt has underlying Alzheimer's dementia which has worsened over the last several months. Patient sister usually feeds on with protein shakes at home due to decreased p.o. intake in general but constant she has been choking on the protein shakes over the last several days. Type of Study: Bedside Swallow Evaluation Previous Swallow Assessment: none in chart Diet Prior to this Study: Regular;Thin liquids Temperature Spikes Noted: No Respiratory Status: Room air History of Recent Intubation: No Behavior/Cognition: Alert;Cooperative;Pleasant mood Oral Cavity Assessment: Within Functional Limits Oral Care Completed by SLP: Recent completion by staff Oral Cavity - Dentition: Adequate natural dentition Vision: Functional  for self-feeding Self-Feeding Abilities: Able to feed self;Needs  assist;Needs set up Patient Positioning: Upright in chair Baseline Vocal Quality: Normal Volitional Cough: Strong Volitional Swallow: Able to elicit    Oral/Motor/Sensory Function Overall Oral Motor/Sensory Function: Within functional limits   Ice Chips Ice chips: Within functional limits   Thin Liquid Thin Liquid: Within functional limits    Nectar Thick Nectar Thick Liquid: Not tested   Honey Thick Honey Thick Liquid: Not tested   Puree Puree: Within functional limits   Solid     Solid: Within functional limits     Shirley [name], CCC-SLP Speech Language Pathologist  Shirley [name] 03/17/2019,11:41 AM

## 2019-03-18 LAB — CBC
HCT: 36.3 % (ref 36.0–46.0)
Hemoglobin: 11.9 g/dL — ABNORMAL LOW (ref 12.0–15.0)
MCH: 31.3 pg (ref 26.0–34.0)
MCHC: 32.8 g/dL (ref 30.0–36.0)
MCV: 95.5 fL (ref 80.0–100.0)
Platelets: 198 10*3/uL (ref 150–400)
RBC: 3.8 MIL/uL — ABNORMAL LOW (ref 3.87–5.11)
RDW: 12 % (ref 11.5–15.5)
WBC: 7.4 10*3/uL (ref 4.0–10.5)
nRBC: 0 % (ref 0.0–0.2)

## 2019-03-18 LAB — BASIC METABOLIC PANEL
Anion gap: 10 (ref 5–15)
BUN: 59 mg/dL — ABNORMAL HIGH (ref 8–23)
CO2: 22 mmol/L (ref 22–32)
Calcium: 9.1 mg/dL (ref 8.9–10.3)
Chloride: 107 mmol/L (ref 98–111)
Creatinine, Ser: 1.52 mg/dL — ABNORMAL HIGH (ref 0.44–1.00)
GFR calc Af Amer: 41 mL/min — ABNORMAL LOW (ref >60)
GFR calc non Af Amer: 35 mL/min — ABNORMAL LOW (ref >60)
Glucose, Bld: 127 mg/dL — ABNORMAL HIGH (ref 70–99)
Potassium: 4.4 mmol/L (ref 3.5–5.1)
Sodium: 139 mmol/L (ref 135–145)

## 2019-03-18 LAB — PROCALCITONIN: Procalcitonin: <0.1 ng/mL

## 2019-03-18 LAB — LACTIC ACID, PLASMA: Lactic Acid, Venous: 0.7 mmol/L (ref 0.5–1.9)

## 2019-03-18 NOTE — Progress Notes (Signed)
Patient has been confused all night, pulling IV's out (4 total) Patient had mitts on and kept getting them off.  Contacted Dr. Scherrie November for an order for restraints.  Order given restraints placed at 0700.

## 2019-03-18 NOTE — Progress Notes (Signed)
Patient has been confused all night.  Patient has removed several IVs ( 4 total) and telemetry.  Patient has removed clothing.  Patient is severely confused and not able to be reoriented.  Mitts have been placed on patient several times. Mitts have been removed.  Patient has had difficulty receiving treatment order by MD due to removal of IV.

## 2019-03-18 NOTE — Progress Notes (Signed)
PROGRESS NOTE    Shirley [name]  [id number] DOB: October 24, 1950 DOA: 03/16/2019 PCP: Wenda Low, MD   Brief Narrative:  Per HPI: Shirley Makowski Loftisis a 69 y.o.femalewith medical history significant ofAlzheimer's dementia who presented to the ER with worsening altered mental status. History has been obtained from the ER record and the family member as the patient has altered mental status and baseline dementia and is unable to provide history. Patient was diagnosed with dementia about 8 years ago.  She has had progressively worsened dementia over the last year and over last 3 months her symptoms have gotten worse with increased confusion, decreased PO intake, increased physical deconditioning.  Patient's sister reportsshe has noticed that the patient was using her right arm less than usual over the last 2 to 3 days. She went to see the patient's primary care provider who sent her in for evaluation of possible stroke. Patient sister usually feeds on with protein shakes at home due to decreased p.o. intake in general but constant she has been choking on the protein shakes over the last several days. No fever, chills, shortness of breath, nausea, vomiting, abdominal pain, diarrhea reported. Sister has not noticed patient deviation but has noticed weakness use of the right upper extremity as noted above.  2/26: Patient was admitted with acute encephalopathy in the setting of Alzheimer's dementia and has been noted to have decreased p.o. intake.  She was noted to be dehydrated and was given IV fluid.  She also has AKI.  Work-up thus far has been unrevealing from a neurological standpoint with a brain MRI demonstrating no acute CVA.  She does appear to have some hazy infiltrates to the right side of her lung suspicious for aspiration especially given her poor SLP evaluation.  I will start her on IV Zosyn given some procalcitonin elevation and continue to monitor for ongoing improvement.  PT  recommending SNF on discharge which sister is agreeable to.  2/27: Patient has had agitation overnight requiring Haldol and some restraints.  She has had her IV replaced with fluids and Zosyn going.  She appears to be more calm and responsive to questioning this morning although she still appears disoriented.  Assessment & Plan:   Principal Problem:   Altered mental status Active Problems:   GERD   Irritable bowel syndrome   OSTEOARTHRITIS, LOWER LEG   DEGENERATIVE DISC DISEASE, LUMBAR SPINE   Backache   Dementia (HCC)   Renal failure   CVA (cerebral vascular accident) ([place])   AMS (altered mental status)   Acute encephalopathy likely metabolic-multifactorial -I believe there is a combination of dehydration with AKI and aspiration pneumonia contributing to this process -She is not currently at her baseline level of mentation as confirmed by her sister on phone -There may also be progression of Alzheimer's dementia -Brain MRI negative for CVA -Continue to monitor with treatment on IV fluid as well as Zosyn -Haldol as needed for agitation with patient currently requiring some restraints  Suspected aspiration pneumonia -Continue on IV Zosyn empirically for now -Monitor procalcitonin and repeat CBC  AKI-improving -Uncertain baseline creatinine with prior in 2012.  Doubling of levels noted on admission -Likely prerenal with dehydration -Continue IV fluid resuscitation and monitor repeat labs in a.m.  Alzheimer's dementia -Continue Depakote -Anticipate discharge to SNF/rehab per PT recommendations  DVT prophylaxis: Heparin Code Status: DNR Family Communication: Discussed with sister on phone Disposition Plan: Continue treatment for medical optimization on IV fluid as well as Zosyn with anticipated discharge to  rehab once improved.   Consultants:   None  Procedures:   See below  Antimicrobials:  Anti-infectives (From admission, onward)   Start     Dose/Rate  Route Frequency Ordered Stop   03/17/19 2200  piperacillin-tazobactam (ZOSYN) IVPB 3.375 g     3.375 g 12.5 mL/hr over 240 Minutes Intravenous Every 8 hours 03/17/19 1356     03/17/19 1230  piperacillin-tazobactam (ZOSYN) IVPB 3.375 g     3.375 g 100 mL/hr over 30 Minutes Intravenous  Once 03/17/19 1214 03/17/19 1545       Subjective: Patient seen and evaluated today with no new acute complaints or concerns.  She was noted to have some significant agitation requiring Haldol last night.  She is currently in restraints.  Objective: Vitals:   03/17/19 1400 03/17/19 1800 03/17/19 2158 03/18/19 0500  BP: (!) 164/80 (!) 147/77 (!) 161/81 (!) 165/105  Pulse: 71 67 80 78  Resp: 18 18 18 18   Temp: 99.1 F (37.3 C) 98.7 F (37.1 C) 97.7 F (36.5 C) 98.1 F (36.7 C)  TempSrc: Oral Oral Axillary Oral  SpO2: 98% 99% 99% 96%  Weight:      Height:        Intake/Output Summary (Last 24 hours) at 03/18/2019 0953 Last data filed at 03/18/2019 [id number] Gross per 24 hour  Intake 650 ml  Output 1050 ml  Net -400 ml   Filed Weights   03/16/19 1549 03/16/19 2248  Weight: 74.8 kg 70.3 kg    Examination:  General exam: Appears calm and comfortable, currently restrained Respiratory system: Clear to auscultation. Respiratory effort normal. Cardiovascular system: S1 & S2 heard, RRR. No JVD, murmurs, rubs, gallops or clicks. No pedal edema. Gastrointestinal system: Abdomen is nondistended, soft and nontender. No organomegaly or masses felt. Normal bowel sounds heard. Central nervous system: Alert and awake, conversational Extremities: No edema Skin: No rashes, lesions or ulcers Psychiatry: Difficult to assess    Data Reviewed: I have personally reviewed following labs and imaging studies  CBC: Recent Labs  Lab 03/16/19 1646 03/18/19 0836  WBC 10.1 7.4  HGB 13.2 11.9*  HCT 40.8 36.3  MCV 98.6 95.5  PLT 204 [id number]   Basic Metabolic Panel: Recent Labs  Lab 03/16/19 1646 03/18/19 0836   NA 138 139  K 4.3 4.4  CL 105 107  CO2 22 22  GLUCOSE 174* 127*  BUN 81* 59*  CREATININE 2.07* 1.52*  CALCIUM 9.4 9.1   GFR: Estimated Creatinine Clearance: 34.1 mL/min (A) (by C-G formula based on SCr of 1.52 mg/dL (H)). Liver Function Tests: No results for input(s): AST, ALT, ALKPHOS, BILITOT, PROT, ALBUMIN in the last 168 hours. No results for input(s): LIPASE, AMYLASE in the last 168 hours. Recent Labs  Lab 03/17/19 1113  AMMONIA 27   Coagulation Profile: No results for input(s): INR, PROTIME in the last 168 hours. Cardiac Enzymes: No results for input(s): CKTOTAL, CKMB, CKMBINDEX, TROPONINI in the last 168 hours. BNP (last 3 results) No results for input(s): PROBNP in the last 8760 hours. HbA1C: Recent Labs    03/17/19 0421  HGBA1C 5.2   CBG: Recent Labs  Lab 03/16/19 1638  GLUCAP 168*   Lipid Profile: Recent Labs    03/17/19 0421  CHOL 143  HDL 38*  LDLCALC 84  TRIG 104  CHOLHDL 3.8   Thyroid Function Tests: Recent Labs    03/17/19 0421  TSH 0.351   Anemia Panel: Recent Labs    03/17/19 0421  VITAMINB12 146*  Sepsis Labs: Recent Labs  Lab 03/17/19 1008 03/18/19 0836  PROCALCITON 0.27  --   LATICACIDVEN  --  0.7    Recent Results (from the past 240 hour(s))  SARS CORONAVIRUS 2 (TAT 6-24 HRS) Nasopharyngeal Nasopharyngeal Swab     Status: None   Collection Time: 03/16/19  8:28 PM   Specimen: Nasopharyngeal Swab  Result Value Ref Range Status   SARS Coronavirus 2 NEGATIVE NEGATIVE Final    Comment: (NOTE) SARS-CoV-2 target nucleic acids are NOT DETECTED. The SARS-CoV-2 RNA is generally detectable in upper and lower respiratory specimens during the acute phase of infection. Negative results do not preclude SARS-CoV-2 infection, do not rule out co-infections with other pathogens, and should not be used as the sole basis for treatment or other patient management decisions. Negative results must be combined with clinical  observations, patient history, and epidemiological information. The expected result is Negative. Fact Sheet for Patients: SugarRoll.be Fact Sheet for Healthcare Providers: https://www.woods-mathews.com/ This test is not yet approved or cleared by the [place] FDA and  has been authorized for detection and/or diagnosis of SARS-CoV-2 by FDA under an Emergency Use Authorization (EUA). This EUA will remain  in effect (meaning this test can be used) for the duration of the COVID-19 declaration under Section 56 4(b)(1) of the Act, 21 U.S.C. section 360bbb-3(b)(1), unless the authorization is terminated or revoked sooner. Performed at [facility], [place] [address], [place], [place] [address]          Radiology Studies: CT Head Wo Contrast  Result Date: 03/16/2019 CLINICAL DATA:  Right-sided weakness since 03/15/2019. EXAM: CT HEAD WITHOUT CONTRAST TECHNIQUE: Contiguous axial images were obtained from the base of the skull through the vertex without intravenous contrast. COMPARISON:  April 22, 2010 FINDINGS: Brain: No evidence of acute infarction, hemorrhage, hydrocephalus, extra-axial collection or mass lesion/mass effect. Atrophy and chronic microvascular ischemic changes are noted. Vascular: No hyperdense vessel or unexpected calcification. Skull: Normal. Negative for fracture or focal lesion. Sinuses/Orbits: No acute finding. Other: None. IMPRESSION: 1. Motion degraded study. 2. No acute abnormality. 3. Atrophy and chronic microvascular ischemic changes are noted. Electronically Signed   By: Constance Holster M.D.   On: 03/16/2019 18:15   MR BRAIN WO CONTRAST  Result Date: 03/17/2019 CLINICAL DATA:  Mental status change.  Neuro deficit. EXAM: MRI HEAD WITHOUT CONTRAST TECHNIQUE: Multiplanar, multiecho pulse sequences of the brain and surrounding structures were obtained without intravenous contrast. COMPARISON:  CT head 03/16/2019 FINDINGS:  Incomplete study. Images significantly degraded by motion. The patient was not able to complete the study Negative for acute infarct on motion degraded diffusion weighted imaging. Generalized atrophy. Periventricular white matter hyperintensity most likely due to chronic ischemia. No mass or midline shift. IMPRESSION: Negative for acute infarct Incomplete study which is degraded by significant motion. Electronically Signed   By: Franchot Gallo M.D.   On: 03/17/2019 08:51   DG Chest Port 1 View  Result Date: 03/16/2019 CLINICAL DATA:  Initial evaluation for possible aspiration. EXAM: PORTABLE CHEST 1 VIEW COMPARISON:  Prior radiograph from 02/11/2005. FINDINGS: Patient is rotated to the right. Mild cardiomegaly, similar to previous. Smoothly margined opacity along the right paratracheal stripe favored to reflect mediastinal vasculature related to patient rotation. Lungs mildly hypoinflated. Mild linear atelectatic changes noted within the retrocardiac left lower lobe. Asymmetric patchy and hazy opacity at the right lung base favored to reflect atelectasis and/or prominent vasculature related to patient rotation. Possible infiltrate could be considered in the correct clinical  setting. No edema or effusion. No pneumothorax. No acute osseous finding. IMPRESSION: 1. Asymmetric patchy and hazy opacity at the right lung base. Atelectasis and/or asymmetric prominence of the pulmonary markings is favored given that the patient is rotated to the right. Possible infiltrate difficult to exclude, and could be considered in the correct clinical setting. 2. Mild linear atelectatic changes within the retrocardiac left lower lobe. Electronically Signed   By: Jeannine Boga M.D.   On: 03/16/2019 22:52   ECHOCARDIOGRAM COMPLETE  Result Date: 03/17/2019    ECHOCARDIOGRAM REPORT   Patient Name:   IEASHA HNAT Date of Exam: 03/17/2019 Medical Rec #:  [id number]       Height:       66.0 in Accession #:    [id number]       Weight:       155.0 lb Date of Birth:  [birth year]       BSA:          1.794 m Patient Age:    10 years        BP:           135/82 mmHg Patient Gender: F               HR:           80 bpm. Exam Location:  [facility] Procedure: 2D Echo Indications:    Stroke 434.91 / I163.9  History:        Patient has no prior history of Echocardiogram examinations.                 Stroke; Risk Factors:Non-Smoker. GERD, Renal Failure,                 Alzheimer's dementia.  Sonographer:    Leavy Cella RDCS (AE) Referring Phys: [place]  1. Left ventricular ejection fraction, by estimation, is 50 to 55%. The left ventricle has low normal function. The left ventricle has no regional wall motion abnormalities. There is mild concentric left ventricular hypertrophy. Left ventricular diastolic parameters are consistent with Grade I diastolic dysfunction (impaired relaxation).  2. Right ventricular systolic function is normal. The right ventricular size is normal.  3. The mitral valve is grossly normal. Trivial mitral valve regurgitation.  4. The aortic valve is tricuspid. Aortic valve regurgitation is mild. No aortic stenosis is present.  5. The inferior vena cava is normal in size with greater than 50% respiratory variability, suggesting right atrial pressure of 3 mmHg. FINDINGS  Left Ventricle: Left ventricular ejection fraction, by estimation, is 50 to 55%. The left ventricle has low normal function. The left ventricle has no regional wall motion abnormalities. The left ventricular internal cavity size was normal in size. There is mild concentric left ventricular hypertrophy. Left ventricular diastolic parameters are consistent with Grade I diastolic dysfunction (impaired relaxation). Indeterminate filling pressures. Right Ventricle: The right ventricular size is normal. No increase in right ventricular wall thickness. Right ventricular systolic function is normal. Left Atrium: Left atrial size was  normal in size. Right Atrium: Right atrial size was normal in size. Pericardium: There is no evidence of pericardial effusion. Mitral Valve: The mitral valve is grossly normal. Mild mitral annular calcification. Trivial mitral valve regurgitation. Tricuspid Valve: The tricuspid valve is grossly normal. Tricuspid valve regurgitation is trivial. Aortic Valve: The aortic valve is tricuspid. Aortic valve regurgitation is mild. No aortic stenosis is present. Pulmonic Valve: The pulmonic valve was grossly normal. Pulmonic valve regurgitation is  not visualized. Aorta: The aortic root is normal in size and structure. Venous: The inferior vena cava is normal in size with greater than 50% respiratory variability, suggesting right atrial pressure of 3 mmHg. IAS/Shunts: No atrial level shunt detected by color flow Doppler.  LEFT VENTRICLE PLAX 2D LVIDd:         4.13 cm  Diastology LVIDs:         3.28 cm  LV e' lateral:   5.77 cm/s LV PW:         1.42 cm  LV E/e' lateral: 11.4 LV IVS:        1.11 cm  LV e' medial:    5.11 cm/s LVOT diam:     2.10 cm  LV E/e' medial:  12.8 LVOT Area:     3.46 cm  RIGHT VENTRICLE RV S prime:     20.00 cm/s TAPSE (M-mode): 2.5 cm LEFT ATRIUM             Index       RIGHT ATRIUM           Index LA diam:        3.70 cm 2.06 cm/m  RA Area:     15.30 cm LA Vol (A2C):   48.7 ml 27.14 ml/m RA Volume:   35.60 ml  19.84 ml/m LA Vol (A4C):   28.1 ml 15.66 ml/m LA Biplane Vol: 38.4 ml 21.40 ml/m   AORTA Ao Root diam: 3.20 cm MITRAL VALVE MV Area (PHT): 3.03 cm    SHUNTS MV Decel Time: 250 msec    Systemic Diam: 2.10 cm MV E velocity: 65.50 cm/s MV A velocity: 89.20 cm/s MV E/A ratio:  0.73 Kate Sable MD Electronically signed by Kate Sable MD Signature Date/Time: 03/17/2019/4:04:16 PM    Final         Scheduled Meds: . aspirin  81 mg Oral Daily  . divalproex  125 mg Oral BID  . feeding supplement (ENSURE ENLIVE)  237 mL Oral BID BM  . heparin  5,000 Units Subcutaneous Q8H  .  hyoscyamine  0.375 mg Oral Once  . sodium chloride flush  3 mL Intravenous Once   Continuous Infusions: . lactated ringers    . piperacillin-tazobactam (ZOSYN)  IV 3.375 g (03/18/19 [id number])     LOS: 2 days    Time spent: 30 minutes    Jackilyn Umphlett Darleen Crocker, DO [facility] Pager [phone]  If 7PM-7AM, please contact night-coverage [email or web address] Password [facility] 03/18/2019, 9:53 AM

## 2019-03-19 LAB — CBC
HCT: 34.2 % — ABNORMAL LOW (ref 36.0–46.0)
Hemoglobin: 10.9 g/dL — ABNORMAL LOW (ref 12.0–15.0)
MCH: 31.3 pg (ref 26.0–34.0)
MCHC: 31.9 g/dL (ref 30.0–36.0)
MCV: 98.3 fL (ref 80.0–100.0)
Platelets: 197 10*3/uL (ref 150–400)
RBC: 3.48 MIL/uL — ABNORMAL LOW (ref 3.87–5.11)
RDW: 12.3 % (ref 11.5–15.5)
WBC: 6.6 10*3/uL (ref 4.0–10.5)
nRBC: 0 % (ref 0.0–0.2)

## 2019-03-19 LAB — PROCALCITONIN: Procalcitonin: <0.1 ng/mL

## 2019-03-19 LAB — BASIC METABOLIC PANEL
Anion gap: 8 (ref 5–15)
BUN: 47 mg/dL — ABNORMAL HIGH (ref 8–23)
CO2: 24 mmol/L (ref 22–32)
Calcium: 8.7 mg/dL — ABNORMAL LOW (ref 8.9–10.3)
Chloride: 108 mmol/L (ref 98–111)
Creatinine, Ser: 1.61 mg/dL — ABNORMAL HIGH (ref 0.44–1.00)
GFR calc Af Amer: 38 mL/min — ABNORMAL LOW (ref >60)
GFR calc non Af Amer: 33 mL/min — ABNORMAL LOW (ref >60)
Glucose, Bld: 113 mg/dL — ABNORMAL HIGH (ref 70–99)
Potassium: 4 mmol/L (ref 3.5–5.1)
Sodium: 140 mmol/L (ref 135–145)

## 2019-03-19 MED ORDER — AMOXICILLIN-POT CLAVULANATE 875-125 MG PO TABS
1.0000 | ORAL_TABLET | Freq: Two times a day (BID) | ORAL | Status: DC
  Administered 2019-03-19 – 2019-03-21 (×5): 1 via ORAL
  Filled 2019-03-19 (×5): qty 1

## 2019-03-19 NOTE — Progress Notes (Signed)
PROGRESS NOTE    Shirley [name]  [id number] DOB: April 13, 1950 DOA: 03/16/2019 PCP: Shirley Low, MD   Brief Narrative:  Per HPI: Shirley Handler Loftisis a 69 y.o.femalewith medical history significant ofAlzheimer's dementia who presented to the ER with worsening altered mental status. History has been obtained from the ER record and the family member as the patient has altered mental status and baseline dementia and is unable to provide history. Patient was diagnosed with dementia about 8 years ago.  She has had progressively worsened dementia over the last year and over last 3 months her symptoms have gotten worse with increased confusion, decreased PO intake, increased physical deconditioning.  Patient's sister reportsshe has noticed that the patient was using her right arm less than usual over the last 2 to 3 days. She went to see the patient's primary care provider who sent her in for evaluation of possible stroke. Patient sister usually feeds on with protein shakes at home due to decreased p.o. intake in general but constant she has been choking on the protein shakes over the last several days. No fever, chills, shortness of breath, nausea, vomiting, abdominal pain, diarrhea reported. Sister has not noticed patient deviation but has noticed weakness use of the right upper extremity as noted above.  2/26:Patient was admitted with acute encephalopathy in the setting of Alzheimer's dementia and has been noted to have decreased p.o. intake. She was noted to be dehydrated and was given IV fluid. She also has AKI. Work-up thus far has been unrevealing from a neurological standpoint with a brain MRI demonstrating no acute CVA. She does appear to have some hazy infiltrates to the right side of her lung suspicious for aspiration especially given her poor SLP evaluation. I will start her on IV Zosyn given some procalcitonin elevation and continue to monitor for ongoing improvement. PT  recommending SNF on discharge which sister is agreeable to.  2/27: Patient has had agitation overnight requiring Haldol and some restraints.  She has had her IV replaced with fluids and Zosyn going.  She appears to be more calm and responsive to questioning this morning although she still appears disoriented.  2/28: Patient continues to remain in restraints this morning and is quite somnolent.  Laboratory data unremarkable otherwise.  She does not appear to have required any Haldol overnight.  We will try to remove restraints today and see how well she does.  Assessment & Plan:   Principal Problem:   Altered mental status Active Problems:   GERD   Irritable bowel syndrome   OSTEOARTHRITIS, LOWER LEG   DEGENERATIVE DISC DISEASE, LUMBAR SPINE   Backache   Dementia (HCC)   Renal failure   CVA (cerebral vascular accident) ([place])   AMS (altered mental status)   Acute encephalopathy likely metabolic-multifactorial -I believe there is a combination of dehydration with AKI and aspiration pneumonia contributing to this process -She is not currently at her baseline level of mentation as confirmed by her sister on phone -There may also be progression of Alzheimer's dementia -Brain MRI negative for CVA -Continue to monitor with treatment on IV fluid as well as Zosyn -Haldol as needed for agitation with patient currently requiring some restraints -Plan to try and remove restraints today and use Haldol as needed.  Suspected aspiration pneumonia -Continue on IV Zosyn empirically for now -Monitor procalcitonin and repeat CBC  AKI-improving -Uncertain baseline creatinine with prior in 2012. Doubling of levels noted on admission -Likely prerenal with dehydration -Continue IV fluid resuscitation and monitor  repeat labsin a.m.  Alzheimer's dementia -Continue Depakote -Anticipate discharge to SNF/rehab per PT recommendations  DVT prophylaxis:Heparin Code Status:DNR Family  Communication:Discussed with sister on phone Disposition Plan:Continue treatment for medical optimization on IV fluid as well as Zosyn with anticipated discharge to rehab once improved.   Consultants:  None  Procedures:  See below  Antimicrobials:  Anti-infectives (From admission, onward)   Start     Dose/Rate Route Frequency Ordered Stop   03/17/19 2200  piperacillin-tazobactam (ZOSYN) IVPB 3.375 g     3.375 g 12.5 mL/hr over 240 Minutes Intravenous Every 8 hours 03/17/19 1356     03/17/19 1230  piperacillin-tazobactam (ZOSYN) IVPB 3.375 g     3.375 g 100 mL/hr over 30 Minutes Intravenous  Once 03/17/19 1214 03/18/19 1112       Subjective: Patient seen and evaluated today with no new acute complaints or concerns.  No agitation noted overnight.  She is still requiring restraints and is somnolent this morning.  Objective: Vitals:   03/18/19 2105 03/19/19 0108 03/19/19 0111 03/19/19 0506  BP: (!) 167/94 (!) 211/82 (!) 162/92 126/60  Pulse: 92 88 94 71  Resp: 16 20  18   Temp: 98.3 F (36.8 C) 99.1 F (37.3 C)  99.2 F (37.3 C)  TempSrc: Oral Oral    SpO2:  100% 100% 100%  Weight:      Height:        Intake/Output Summary (Last 24 hours) at 03/19/2019 0930 Last data filed at 03/18/2019 1827 Gross per 24 hour  Intake 300 ml  Output 900 ml  Net -600 ml   Filed Weights   03/16/19 1549 03/16/19 2248  Weight: 74.8 kg 70.3 kg    Examination:  General exam: Somnolent and in restraints Respiratory system: Clear to auscultation. Respiratory effort normal. Cardiovascular system: S1 & S2 heard, RRR. No JVD, murmurs, rubs, gallops or clicks. No pedal edema. Gastrointestinal system: Abdomen is nondistended, soft and nontender. No organomegaly or masses felt. Normal bowel sounds heard. Central nervous system: Somnolent Extremities: No edema, upper extremities restrained Skin: No rashes, lesions or ulcers Psychiatry: Cannot be assessed given patient  condition    Data Reviewed: I have personally reviewed following labs and imaging studies  CBC: Recent Labs  Lab 03/16/19 1646 03/18/19 0836 03/19/19 0622  WBC 10.1 7.4 6.6  HGB 13.2 11.9* 10.9*  HCT 40.8 36.3 34.2*  MCV 98.6 95.5 98.3  PLT 204 198 [id number]   Basic Metabolic Panel: Recent Labs  Lab 03/16/19 1646 03/18/19 0836 03/19/19 0622  NA 138 139 140  K 4.3 4.4 4.0  CL 105 107 108  CO2 22 22 24   GLUCOSE 174* 127* 113*  BUN 81* 59* 47*  CREATININE 2.07* 1.52* 1.61*  CALCIUM 9.4 9.1 8.7*   GFR: Estimated Creatinine Clearance: 32.2 mL/min (A) (by C-G formula based on SCr of 1.61 mg/dL (H)). Liver Function Tests: No results for input(s): AST, ALT, ALKPHOS, BILITOT, PROT, ALBUMIN in the last 168 hours. No results for input(s): LIPASE, AMYLASE in the last 168 hours. Recent Labs  Lab 03/17/19 1113  AMMONIA 27   Coagulation Profile: No results for input(s): INR, PROTIME in the last 168 hours. Cardiac Enzymes: No results for input(s): CKTOTAL, CKMB, CKMBINDEX, TROPONINI in the last 168 hours. BNP (last 3 results) No results for input(s): PROBNP in the last 8760 hours. HbA1C: Recent Labs    03/17/19 0421  HGBA1C 5.2   CBG: Recent Labs  Lab 03/16/19 1638  GLUCAP 168*   Lipid  Profile: Recent Labs    03/17/19 0421  CHOL 143  HDL 38*  LDLCALC 84  TRIG 104  CHOLHDL 3.8   Thyroid Function Tests: Recent Labs    03/17/19 0421  TSH 0.351   Anemia Panel: Recent Labs    03/17/19 0421  VITAMINB12 146*   Sepsis Labs: Recent Labs  Lab 03/17/19 1008 03/18/19 0836 03/19/19 0622  PROCALCITON 0.27 <0.10 <0.10  LATICACIDVEN  --  0.7  --     Recent Results (from the past 240 hour(s))  SARS CORONAVIRUS 2 (TAT 6-24 HRS) Nasopharyngeal Nasopharyngeal Swab     Status: None   Collection Time: 03/16/19  8:28 PM   Specimen: Nasopharyngeal Swab  Result Value Ref Range Status   SARS Coronavirus 2 NEGATIVE NEGATIVE Final    Comment: (NOTE) SARS-CoV-2 target  nucleic acids are NOT DETECTED. The SARS-CoV-2 RNA is generally detectable in upper and lower respiratory specimens during the acute phase of infection. Negative results do not preclude SARS-CoV-2 infection, do not rule out co-infections with other pathogens, and should not be used as the sole basis for treatment or other patient management decisions. Negative results must be combined with clinical observations, patient history, and epidemiological information. The expected result is Negative. Fact Sheet for Patients: SugarRoll.be Fact Sheet for Healthcare Providers: https://www.woods-mathews.com/ This test is not yet approved or cleared by the [place] FDA and  has been authorized for detection and/or diagnosis of SARS-CoV-2 by FDA under an Emergency Use Authorization (EUA). This EUA will remain  in effect (meaning this test can be used) for the duration of the COVID-19 declaration under Section 56 4(b)(1) of the Act, 21 U.S.C. section 360bbb-3(b)(1), unless the authorization is terminated or revoked sooner. Performed at [facility], [place] [address], [place], [place] [address]          Radiology Studies: ECHOCARDIOGRAM COMPLETE  Result Date: 03/17/2019    ECHOCARDIOGRAM REPORT   Patient Name:   CECY GRIDLEY Date of Exam: 03/17/2019 Medical Rec #:  [id number]       Height:       66.0 in Accession #:    [id number]      Weight:       155.0 lb Date of Birth:  [birth year]       BSA:          1.794 m Patient Age:    33 years        BP:           135/82 mmHg Patient Gender: F               HR:           80 bpm. Exam Location:  [facility] Procedure: 2D Echo Indications:    Stroke 434.91 / I163.9  History:        Patient has no prior history of Echocardiogram examinations.                 Stroke; Risk Factors:Non-Smoker. GERD, Renal Failure,                 Alzheimer's dementia.  Sonographer:    Leavy Cella RDCS (AE) Referring Phys:  [place]  1. Left ventricular ejection fraction, by estimation, is 50 to 55%. The left ventricle has [name] normal function. The left ventricle has no regional wall motion abnormalities. There is mild concentric left ventricular hypertrophy. Left ventricular diastolic parameters are consistent with Grade I diastolic dysfunction (impaired  relaxation).  2. Right ventricular systolic function is normal. The right ventricular size is normal.  3. The mitral valve is grossly normal. Trivial mitral valve regurgitation.  4. The aortic valve is tricuspid. Aortic valve regurgitation is mild. No aortic stenosis is present.  5. The inferior vena cava is normal in size with greater than 50% respiratory variability, suggesting right atrial pressure of 3 mmHg. FINDINGS  Left Ventricle: Left ventricular ejection fraction, by estimation, is 50 to 55%. The left ventricle has [name] normal function. The left ventricle has no regional wall motion abnormalities. The left ventricular internal cavity size was normal in size. There is mild concentric left ventricular hypertrophy. Left ventricular diastolic parameters are consistent with Grade I diastolic dysfunction (impaired relaxation). Indeterminate filling pressures. Right Ventricle: The right ventricular size is normal. No increase in right ventricular wall thickness. Right ventricular systolic function is normal. Left Atrium: Left atrial size was normal in size. Right Atrium: Right atrial size was normal in size. Pericardium: There is no evidence of pericardial effusion. Mitral Valve: The mitral valve is grossly normal. Mild mitral annular calcification. Trivial mitral valve regurgitation. Tricuspid Valve: The tricuspid valve is grossly normal. Tricuspid valve regurgitation is trivial. Aortic Valve: The aortic valve is tricuspid. Aortic valve regurgitation is mild. No aortic stenosis is present. Pulmonic Valve: The pulmonic valve was grossly normal. Pulmonic  valve regurgitation is not visualized. Aorta: The aortic root is normal in size and structure. Venous: The inferior vena cava is normal in size with greater than 50% respiratory variability, suggesting right atrial pressure of 3 mmHg. IAS/Shunts: No atrial level shunt detected by color flow Doppler.  LEFT VENTRICLE PLAX 2D LVIDd:         4.13 cm  Diastology LVIDs:         3.28 cm  LV e' lateral:   5.77 cm/s LV PW:         1.42 cm  LV E/e' lateral: 11.4 LV IVS:        1.11 cm  LV e' medial:    5.11 cm/s LVOT diam:     2.10 cm  LV E/e' medial:  12.8 LVOT Area:     3.46 cm  RIGHT VENTRICLE RV S prime:     20.00 cm/s TAPSE (M-mode): 2.5 cm LEFT ATRIUM             Index       RIGHT ATRIUM           Index LA diam:        3.70 cm 2.06 cm/m  RA Area:     15.30 cm LA Vol (A2C):   48.7 ml 27.14 ml/m RA Volume:   35.60 ml  19.84 ml/m LA Vol (A4C):   28.1 ml 15.66 ml/m LA Biplane Vol: 38.4 ml 21.40 ml/m   AORTA Ao Root diam: 3.20 cm MITRAL VALVE MV Area (PHT): 3.03 cm    SHUNTS MV Decel Time: 250 msec    Systemic Diam: 2.10 cm MV E velocity: 65.50 cm/s MV A velocity: 89.20 cm/s MV E/A ratio:  0.73 Kate Sable MD Electronically signed by Kate Sable MD Signature Date/Time: 03/17/2019/4:04:16 PM    Final         Scheduled Meds: . aspirin  81 mg Oral Daily  . divalproex  125 mg Oral BID  . feeding supplement (ENSURE ENLIVE)  237 mL Oral BID BM  . heparin  5,000 Units Subcutaneous Q8H  . hyoscyamine  0.375 mg Oral Once  .  sodium chloride flush  3 mL Intravenous Once   Continuous Infusions: . lactated ringers 100 mL/hr at 03/19/19 0543  . piperacillin-tazobactam (ZOSYN)  IV 3.375 g (03/19/19 0545)     LOS: 3 days    Time spent: 30 minutes    Cassiopeia Florentino Darleen Crocker, DO [facility] Pager [phone]  If 7PM-7AM, please contact night-coverage [email or web address] Password [email or web address] 03/19/2019, 9:30 AM

## 2019-03-19 NOTE — Progress Notes (Signed)
Restraint order renewed by Dr. Scherrie November at [phone] on 03/19/2019.   Restraints continued due to patient dementia and pulling out IVs.

## 2019-03-19 NOTE — Progress Notes (Signed)
Patient out of restraints at this time. Mitts in place. Patient got one mitt off and iv was out. Paged MD.MD said ok to leave iv out and switch to po antibiotics. Per MD patient needs to be 24hrs out of restraints to be discharge to snf. Will continue to assess patient throughout shift. Marland Kitchen

## 2019-03-19 NOTE — Progress Notes (Signed)
MEWS Guidelines - (patients age 67 and over)  Red - At High Risk for Deterioration Yellow - At risk for Deterioration  1. Go to room and assess patient 2. Validate data. Is this patient's baseline? If data confirmed: 3. Is this an acute change? 4. Administer prn meds/treatments as ordered. 5. Note Sepsis score 6. Review goals of care 7. Sports coach, RRT nurse and Provider. 8. Ask Provider to come to bedside.  9. Document patient condition/interventions/response. 10. Increase frequency of vital signs and focused assessments to at least q15 minutes x 4, then q30 minutes x2. - If stable, then q1h x3, then q4h x3 and then q8h or dept. routine. - If unstable, contact Provider & RRT nurse. Prepare for possible transfer. 11. Add entry in progress notes using the smart phrase ".MEWS". 1. Go to room and assess patient 2. Validate data. Is this patient's baseline? If data confirmed: 3. Is this an acute change? 4. Administer prn meds/treatments as ordered? 5. Note Sepsis score 6. Review goals of care 7. Sports coach and Provider 8. Call RRT nurse as needed. 9. Document patient condition/interventions/response. 10. Increase frequency of vital signs and focused assessments to at least q2h x2. - If stable, then q4h x2 and then q8h or dept. routine. - If unstable, contact Provider & RRT nurse. Prepare for possible transfer. 11. Add entry in progress notes using the smart phrase ".MEWS".  Green - Likely stable Lavender - Comfort Care Only  1. Continue routine/ordered monitoring.  2. Review goals of care. 1. Continue routine/ordered monitoring. 2. Review goals of care.    Patient went from green to yellow due to BP. At 0108, BP=211/82, rechecked at 0111 BP=162/92 which is in line with what patient has been running and MD is aware of this.  No changes or new orders given.

## 2019-03-20 ENCOUNTER — Inpatient Hospital Stay (HOSPITAL_COMMUNITY): Payer: Medicare Other

## 2019-03-20 DIAGNOSIS — R531 Weakness: Secondary | ICD-10-CM

## 2019-03-20 DIAGNOSIS — F0281 Dementia in other diseases classified elsewhere with behavioral disturbance: Secondary | ICD-10-CM

## 2019-03-20 DIAGNOSIS — Z7189 Other specified counseling: Secondary | ICD-10-CM

## 2019-03-20 DIAGNOSIS — E860 Dehydration: Secondary | ICD-10-CM

## 2019-03-20 DIAGNOSIS — G309 Alzheimer's disease, unspecified: Secondary | ICD-10-CM

## 2019-03-20 DIAGNOSIS — Z515 Encounter for palliative care: Secondary | ICD-10-CM

## 2019-03-20 LAB — BASIC METABOLIC PANEL
Anion gap: 9 (ref 5–15)
BUN: 39 mg/dL — ABNORMAL HIGH (ref 8–23)
CO2: 23 mmol/L (ref 22–32)
Calcium: 9.1 mg/dL (ref 8.9–10.3)
Chloride: 107 mmol/L (ref 98–111)
Creatinine, Ser: 1.4 mg/dL — ABNORMAL HIGH (ref 0.44–1.00)
GFR calc Af Amer: 45 mL/min — ABNORMAL LOW (ref >60)
GFR calc non Af Amer: 39 mL/min — ABNORMAL LOW (ref >60)
Glucose, Bld: 101 mg/dL — ABNORMAL HIGH (ref 70–99)
Potassium: 3.9 mmol/L (ref 3.5–5.1)
Sodium: 139 mmol/L (ref 135–145)

## 2019-03-20 LAB — MAGNESIUM: Magnesium: 2 mg/dL (ref 1.7–2.4)

## 2019-03-20 MED ORDER — APIXABAN 5 MG PO TABS: 10.0000 mg | ORAL_TABLET | Freq: Two times a day (BID) | ORAL | Status: DC

## 2019-03-20 MED ORDER — APIXABAN 5 MG PO TABS: 5.0000 mg | ORAL_TABLET | Freq: Two times a day (BID) | ORAL | Status: DC

## 2019-03-20 MED ORDER — APIXABAN 5 MG PO TABS
10.0000 mg | ORAL_TABLET | Freq: Two times a day (BID) | ORAL | Status: DC
  Administered 2019-03-20 – 2019-03-21 (×3): 10 mg via ORAL
  Filled 2019-03-20 (×3): qty 2

## 2019-03-20 NOTE — Care Management (Signed)
Discussed bed offers with sister and CMS medicare star ratings. She elects [facility] [place].

## 2019-03-20 NOTE — Progress Notes (Addendum)
PROGRESS NOTE    Shirley [name]  [id number] DOB: 1950-06-26 DOA: 03/16/2019 PCP: Wenda Low, MD   Brief Narrative:  Per HPI: Shirley Lorenzen Loftisis a 69 y.o.femalewith medical history significant ofAlzheimer's dementia who presented to the ER with worsening altered mental status. History has been obtained from the ER record and the family member as the patient has altered mental status and baseline dementia and is unable to provide history. Patient was diagnosed with dementia about 8 years ago.  She has had progressively worsened dementia over the last year and over last 3 months her symptoms have gotten worse with increased confusion, decreased PO intake, increased physical deconditioning.  Patient's sister reportsshe has noticed that the patient was using her right arm less than usual over the last 2 to 3 days. She went to see the patient's primary care provider who sent her in for evaluation of possible stroke. Patient sister usually feeds on with protein shakes at home due to decreased p.o. intake in general but constant she has been choking on the protein shakes over the last several days. No fever, chills, shortness of breath, nausea, vomiting, abdominal pain, diarrhea reported. Sister has not noticed patient deviation but has noticed weakness use of the right upper extremity as noted above.  2/26:Patient was admitted with acute encephalopathy in the setting of Alzheimer's dementia and has been noted to have decreased p.o. intake. She was noted to be dehydrated and was given IV fluid. She also has AKI. Work-up thus far has been unrevealing from a neurological standpoint with a brain MRI demonstrating no acute CVA. She does appear to have some hazy infiltrates to the right side of her lung suspicious for aspiration especially given her poor SLP evaluation. I will start her on IV Zosyn given some procalcitonin elevation and continue to monitor for ongoing improvement. PT  recommending SNF on discharge which sister is agreeable to.  2/27:Patient has had agitation overnight requiring Haldol and some restraints. She has had her IV replaced with fluids and Zosyn going. She appears to be more calm and responsive to questioning this morning although she still appears disoriented.  2/28: Patient continues to remain in restraints this morning and is quite somnolent.  Laboratory data unremarkable otherwise.  She does not appear to have required any Haldol overnight.  We will try to remove restraints today and see how well she does.  3/1: Patient continues to remain confused and as more awake this morning.  Right lower extremity swelling noted for which ultrasound performed with clot noted that is near occlusive.  She appears to have good perfusion to her lower extremity.  Will be started on Eliquis for anticoagulation.  Assessment & Plan:   Principal Problem:   Altered mental status Active Problems:   GERD   Irritable bowel syndrome   OSTEOARTHRITIS, LOWER LEG   DEGENERATIVE DISC DISEASE, LUMBAR SPINE   Backache   Dementia (HCC)   Renal failure   CVA (cerebral vascular accident) ([place])   AMS (altered mental status)   Acute encephalopathy likely metabolic-multifactorial -I believe there is a combination of dehydration with AKI and aspiration pneumonia contributing to this process -She is not currently at her baseline level of mentation as confirmed by her sister on phone -There may also be progression of Alzheimer's dementia -Brain MRI negative for CVA -Haldol as needed for agitation with patient currently requiring some restraints -Patient has been off of restraints for last 24 hours with no need for Haldol  Suspected aspiration pneumonia -  IV Zosyn to Augmentin on 2/28, continue for now  AKI-improving -Uncertain baseline creatinine with prior in 2012. Doubling of levels noted on admission -Likely prerenal with dehydration -No further need for IV  fluid and tolerating p.o.  Right lower extremity DVT, near occlusive -Start Eliquis for DVT treatment and monitor -No phlegmasia noted on exam  Alzheimer's dementia -Continue Depakote -Anticipate discharge to SNF/rehab per PT recommendations  DVT prophylaxis:Eliquis Code Status:DNR Family Communication:Discussed with sister on phone Disposition Plan:Continue treatment for medical optimization on IV fluid as well as Zosyn with anticipated discharge to rehab once improved.  Start Eliquis for DVT.   Consultants:  None  Procedures:  See below  Antimicrobials:  Anti-infectives (From admission, onward)   Start     Dose/Rate Route Frequency Ordered Stop   03/19/19 1100  amoxicillin-clavulanate (AUGMENTIN) 875-125 MG per tablet 1 tablet     1 tablet Oral Every 12 hours 03/19/19 1052     03/17/19 2200  piperacillin-tazobactam (ZOSYN) IVPB 3.375 g  Status:  Discontinued     3.375 g 12.5 mL/hr over 240 Minutes Intravenous Every 8 hours 03/17/19 1356 03/19/19 1052   03/17/19 1230  piperacillin-tazobactam (ZOSYN) IVPB 3.375 g     3.375 g 100 mL/hr over 30 Minutes Intravenous  Once 03/17/19 1214 03/18/19 1112       Subjective: Patient seen and evaluated today with no new acute complaints or concerns. No acute concerns or events noted overnight. She still appears quite confused. Right leg is more swollen than the left.  Objective: Vitals:   03/19/19 1132 03/19/19 1357 03/19/19 2136 03/20/19 0506  BP: 113/89 126/73 (!) 189/76 (!) 147/75  Pulse: 80 69 89 75  Resp:  20 20 18   Temp:  97.9 F (36.6 C) 98.8 F (37.1 C) 98.5 F (36.9 C)  TempSrc:   Oral   SpO2: 96% 100% 100% 99%  Weight:      Height:        Intake/Output Summary (Last 24 hours) at 03/20/2019 1340 Last data filed at 03/20/2019 0944 Gross per 24 hour  Intake 120 ml  Output --  Net 120 ml   Filed Weights   03/16/19 1549 03/16/19 2248  Weight: 74.8 kg 70.3 kg    Examination:  General exam: Appears  calm and comfortable  Respiratory system: Clear to auscultation. Respiratory effort normal. Cardiovascular system: S1 & S2 heard, RRR. No JVD, murmurs, rubs, gallops or clicks.  Right lower extremity edema noted. Gastrointestinal system: Abdomen is nondistended, soft and nontender. No organomegaly or masses felt. Normal bowel sounds heard. Central nervous system: Alert and awake, confused Extremities: Symmetric 5 x 5 power. Skin: No rashes, lesions or ulcers Psychiatry: Flat affect    Data Reviewed: I have personally reviewed following labs and imaging studies  CBC: Recent Labs  Lab 03/16/19 1646 03/18/19 0836 03/19/19 0622  WBC 10.1 7.4 6.6  HGB 13.2 11.9* 10.9*  HCT 40.8 36.3 34.2*  MCV 98.6 95.5 98.3  PLT 204 198 [id number]   Basic Metabolic Panel: Recent Labs  Lab 03/16/19 1646 03/18/19 0836 03/19/19 0622 03/20/19 0447  NA 138 139 140 139  K 4.3 4.4 4.0 3.9  CL 105 107 108 107  CO2 22 22 24 23   GLUCOSE 174* 127* 113* 101*  BUN 81* 59* 47* 39*  CREATININE 2.07* 1.52* 1.61* 1.40*  CALCIUM 9.4 9.1 8.7* 9.1  MG  --   --   --  2.0   GFR: Estimated Creatinine Clearance: 37 mL/min (A) (by C-G  formula based on SCr of 1.4 mg/dL (H)). Liver Function Tests: No results for input(s): AST, ALT, ALKPHOS, BILITOT, PROT, ALBUMIN in the last 168 hours. No results for input(s): LIPASE, AMYLASE in the last 168 hours. Recent Labs  Lab 03/17/19 1113  AMMONIA 27   Coagulation Profile: No results for input(s): INR, PROTIME in the last 168 hours. Cardiac Enzymes: No results for input(s): CKTOTAL, CKMB, CKMBINDEX, TROPONINI in the last 168 hours. BNP (last 3 results) No results for input(s): PROBNP in the last 8760 hours. HbA1C: No results for input(s): HGBA1C in the last 72 hours. CBG: Recent Labs  Lab 03/16/19 1638  GLUCAP 168*   Lipid Profile: No results for input(s): CHOL, HDL, LDLCALC, TRIG, CHOLHDL, LDLDIRECT in the last 72 hours. Thyroid Function Tests: No results for  input(s): TSH, T4TOTAL, FREET4, T3FREE, THYROIDAB in the last 72 hours. Anemia Panel: No results for input(s): VITAMINB12, FOLATE, FERRITIN, TIBC, IRON, RETICCTPCT in the last 72 hours. Sepsis Labs: Recent Labs  Lab 03/17/19 1008 03/18/19 0836 03/19/19 0622  PROCALCITON 0.27 <0.10 <0.10  LATICACIDVEN  --  0.7  --     Recent Results (from the past 240 hour(s))  SARS CORONAVIRUS 2 (TAT 6-24 HRS) Nasopharyngeal Nasopharyngeal Swab     Status: None   Collection Time: 03/16/19  8:28 PM   Specimen: Nasopharyngeal Swab  Result Value Ref Range Status   SARS Coronavirus 2 NEGATIVE NEGATIVE Final    Comment: (NOTE) SARS-CoV-2 target nucleic acids are NOT DETECTED. The SARS-CoV-2 RNA is generally detectable in upper and lower respiratory specimens during the acute phase of infection. Negative results do not preclude SARS-CoV-2 infection, do not rule out co-infections with other pathogens, and should not be used as the sole basis for treatment or other patient management decisions. Negative results must be combined with clinical observations, patient history, and epidemiological information. The expected result is Negative. Fact Sheet for Patients: SugarRoll.be Fact Sheet for Healthcare Providers: https://www.woods-mathews.com/ This test is not yet approved or cleared by the [place] FDA and  has been authorized for detection and/or diagnosis of SARS-CoV-2 by FDA under an Emergency Use Authorization (EUA). This EUA will remain  in effect (meaning this test can be used) for the duration of the COVID-19 declaration under Section 56 4(b)(1) of the Act, 21 U.S.C. section 360bbb-3(b)(1), unless the authorization is terminated or revoked sooner. Performed at [facility], [place] [address] [place], [place] [address]          Radiology Studies: US Venous Img Lower Unilateral Right (DVT)  Result Date: 03/20/2019 CLINICAL DATA:  Right  lower extremity pain. History of previous DVT. Evaluate for acute or chronic DVT. EXAM: RIGHT LOWER EXTREMITY VENOUS DOPPLER ULTRASOUND TECHNIQUE: Gray-scale sonography with graded compression, as well as color Doppler and duplex ultrasound were performed to evaluate the lower extremity deep venous systems from the level of the common femoral vein and including the common femoral, femoral, profunda femoral, popliteal and calf veins including the posterior tibial, peroneal and gastrocnemius veins when visible. The superficial great saphenous vein was also interrogated. Spectral Doppler was utilized to evaluate flow at rest and with distal augmentation maneuvers in the common femoral, femoral and popliteal veins. COMPARISON:  Right lower extremity venous Doppler ultrasound-09/19/2007 FINDINGS: Contralateral Common Femoral Vein: Respiratory phasicity is normal and symmetric with the symptomatic side. No evidence of thrombus. Normal compressibility. Common Femoral Vein: No evidence of thrombus. Normal compressibility, respiratory phasicity and response to augmentation. Saphenofemoral Junction: No evidence of thrombus. Normal  compressibility and flow on color Doppler imaging. Profunda Femoral Vein: No evidence of thrombus. Normal compressibility and flow on color Doppler imaging. Femoral Vein: While the proximal aspect of the right femoral vein appears patent (image 33), there is hypoechoic near occlusive thrombus involving the mid (image 38) and distal (image 47) aspects of the right femoral vein. Popliteal Vein: There is hypoechoic expansile occlusive thrombus within the right popliteal vein (images 52 and 58). Calf Veins: Appear patent where imaged. Superficial Great Saphenous Vein: No evidence of thrombus. Normal compressibility. Other Findings:  None. IMPRESSION: Examination is positive for occlusive/near occlusive DVT involving the mid and distal aspects of the right femoral vein extending through the right  popliteal vein. Electronically Signed   By: Sandi Mariscal M.D.   On: 03/20/2019 13:18        Scheduled Meds: . amoxicillin-clavulanate  1 tablet Oral Q12H  . apixaban  10 mg Oral BID  . aspirin  81 mg Oral Daily  . divalproex  125 mg Oral BID  . feeding supplement (ENSURE ENLIVE)  237 mL Oral BID BM  . heparin  5,000 Units Subcutaneous Q8H  . hyoscyamine  0.375 mg Oral Once  . sodium chloride flush  3 mL Intravenous Once   Continuous Infusions:   LOS: 4 days    Time spent: 30 minutes    Osmel Dykstra Darleen Crocker, DO [facility] Pager [phone]  If 7PM-7AM, please contact night-coverage [email or web address] Password [email or web address] 03/20/2019, 1:40 PM

## 2019-03-20 NOTE — Care Management Important Message (Signed)
Important Message  Patient Details  Name: Shirley [name] MRN: [id number] Date of Birth: 01/11/1951   Medicare Important Message Given:  Yes     Tommy Medal 03/20/2019, 3:45 PM

## 2019-03-20 NOTE — Progress Notes (Signed)
R leg noticed to be more swollen than the left on shift assessment. MD notified at bedside. [place] ordered.

## 2019-03-20 NOTE — Consult Note (Signed)
Consultation Note Date: 03/20/2019   Patient Name: Shirley [name]  DOB: 11/16/1950  MRN: [id number]  Age / Sex: 69 y.o., female  PCP: Wenda Low, MD Referring Physician: Rodena Goldmann, DO  Reason for Consultation: Establishing goals of care  HPI/Patient Profile: 69 y.o. female  with past medical history of Alzheimer's dementia admitted on 03/16/2019 with altered mental status and possible stroke. Work-up negative for acute stroke. Hospital admission for multi-factorial encephalopathy with dehydration, AKI, and possible aspiration pneumonia. Clinical improvement with IVF and antibiotics. Palliative medicine consultation for goals of care.   Clinical Assessment and Goals of Care:  I have reviewed medical records, discussed with Dr. Manuella Ghazi and assessed the patient. Fayrene is oriented to name, otherwise disoriented with baseline dementia and pleasant confusion. She does not appear to be in pain or discomfort. She will follow simple commands. No family at bedside.   Spoke with sister, Shirley [name] via telephone to discuss goals of care.   I introduced Palliative Medicine as specialized medical care for people living with serious illness. It focuses on providing relief from the symptoms and stress of a serious illness. The goal is to improve quality of life for both the patient and the family.  We discussed a brief life review of the patient. Shirley [name] shares that her sister was born at [facility]. They live in [place]. Shirley [name] previously worked for [employer] back, before her health decline. Shirley [name] has been living with Shirley [name] for about 8 years with progressive Alzheimer's dementia. Prior to admission, functional/cognitive/nutritional status decline and it has been more challenging for Shirley [name] to care for her sister in the home.   Discussed course of hospitalization including diagnoses, interventions, and plan  of care. The natural disease trajectory and expectations at EOL were discussed. Shirley [name] cared for their mother who also had Alzheimer's dementia, therefore Shirley [name] is familiar with disease trajectory and expectations. Shirley [name] shares that the patient's PCP and adult daycare employee (who knows Leaira well) both expressed that she may need hospice services soon.   Advanced directives, concepts specific to code status, artifical feeding and hydration, and rehospitalization were considered and discussed. Shirley [name] shares that they completed POA paperwork many years ago when Shirley [name] was 'lucid.' Shirley [name] confirms DNR/DNI decision and NO feeding tube. Requested documentation for EMR.  Discussed plan for discharge to SNF for [facility] to attempt therapy. If Jemina continues to decline following hospitalization, Shirley [name] would be open to hospice discussions, sharing that she "would want her to be comfortable" and "no pain." Explained outpatient palliative referral with transition to hospice services in the near future if appropriate. Shirley [name] agrees.   Questions addressed. Shirley [name] plans to speak with [facility] team today regarding which facility [name] will discharge to.    SUMMARY OF RECOMMENDATIONS    Patient's sister, [name], is primary caregiver and POA. Documentation not on file, requested.   DNR/DNI, NO feeding tube.  Continue current plan of care and medical management. Clinical improvement from IVF/ABX.  Disposition plan: SNF rehab. Recommend outpatient palliative referral and  transition to hospice services if patient does not progress at rehab. Patient's sister is realistic and knowledgeable with Alzheimer's trajectory and expectations. When Shirley [name]'s health continues to decline, Shirley [name] is open to hospice discussions and would wish for comfort and symptom management at EOL.   Code Status/Advance Care Planning:  DNR/DNI  Symptom Management:   Per attending  Palliative Prophylaxis:    Aspiration, Delirium Protocol, Oral Care and Turn Reposition  Psycho-social/Spiritual:   Desire for further Chaplaincy support:yes  Additional Recommendations: Caregiving  Support/Resources, Compassionate Wean Education and Education on Hospice  Prognosis:   Unable to determine: guarded long-term with progressive Alzheimer's dementia  Discharge Planning: [place] for rehab with Palliative care service follow-up      Primary Diagnoses: Present on Admission: . GERD . DEGENERATIVE DISC DISEASE, LUMBAR SPINE . Irritable bowel syndrome . OSTEOARTHRITIS, LOWER LEG . Backache . AMS (altered mental status)   I have reviewed the medical record, interviewed the patient and family, and examined the patient. The following aspects are pertinent.  Past Medical History:  Diagnosis Date  . Alzheimer's dementia ([place])   . Renal disorder    Social History   Socioeconomic History  . Marital status: Divorced    Spouse name: Not on file  . Number of children: Not on file  . Years of education: Not on file  . Highest education level: Not on file  Occupational History  . Not on file  Tobacco Use  . Smoking status: Never Smoker  . Smokeless tobacco: Never Used  Substance and Sexual Activity  . Alcohol use: No  . Drug use: No  . Sexual activity: Not Currently  Other Topics Concern  . Not on file  Social History Narrative  . Not on file   Social Determinants of Health   Financial Resource Strain:   . Difficulty of Paying Living Expenses: Not on file  Food Insecurity:   . Worried About Charity fundraiser in the Last Year: Not on file  . Ran Out of Food in the Last Year: Not on file  Transportation Needs:   . Lack of Transportation (Medical): Not on file  . Lack of Transportation (Non-Medical): Not on file  Physical Activity:   . Days of Exercise per Week: Not on file  . Minutes of Exercise per Session: Not on file  Stress:   . Feeling of Stress : Not on  file  Social Connections:   . Frequency of Communication with Friends and Family: Not on file  . Frequency of Social Gatherings with Friends and Family: Not on file  . Attends Religious Services: Not on file  . Active Member of Clubs or Organizations: Not on file  . Attends Archivist Meetings: Not on file  . Marital Status: Not on file   History reviewed. No pertinent family history. Scheduled Meds: . amoxicillin-clavulanate  1 tablet Oral Q12H  . apixaban  10 mg Oral BID   Followed by  . [START ON 03/27/2019] apixaban  5 mg Oral BID  . aspirin  81 mg Oral Daily  . divalproex  125 mg Oral BID  . feeding supplement (ENSURE ENLIVE)  237 mL Oral BID BM  . hyoscyamine  0.375 mg Oral Once  . sodium chloride flush  3 mL Intravenous Once   Continuous Infusions: PRN Meds:.acetaminophen **OR** acetaminophen (TYLENOL) oral liquid 160 mg/5 mL **OR** acetaminophen, haloperidol, haloperidol lactate, senna-docusate Medications Prior to Admission:  Prior to Admission medications   Medication Sig Start Date  End Date Taking? Authorizing Provider  divalproex (DEPAKOTE) 125 MG DR tablet Take 125 mg by mouth 2 (two) times daily.   Yes [provider]   Allergies  Allergen Reactions  . Morphine And Related Anaphylaxis  . Buspirone Hcl Other (See Comments)    Unknown   . Celecoxib Other (See Comments)    Unknown   . Cephalexin Other (See Comments)    Unknown   . Enalapril Maleate Other (See Comments)    Unknown   . Lithium   . Prednisone Other (See Comments)    Unknown   . Rofecoxib Other (See Comments)    Vioxx- Unknown   . Tylenol [Acetaminophen] Other (See Comments)    Unknown    Review of Systems  Unable to perform ROS: Dementia   Physical Exam Vitals and nursing note reviewed.  Constitutional:      General: She is awake.  HENT:     Head: Normocephalic and atraumatic.  Cardiovascular:     Rate and Rhythm: Regular rhythm.  Pulmonary:     Effort: No tachypnea,  accessory muscle usage or respiratory distress.     Breath sounds: Normal breath sounds.  Abdominal:     General: Bowel sounds are normal.     Tenderness: There is no abdominal tenderness.  Skin:    General: Skin is warm and dry.  Neurological:     Mental Status: She is alert.     Comments: Oriented to name, otherwise disoriented with baseline dementia  Psychiatric:        Attention and Perception: She is inattentive.        Cognition and Memory: Cognition is impaired.    Vital Signs: BP (!) 147/75 (BP Location: Left Arm)   Pulse 75   Temp 98.5 F (36.9 C)   Resp 18   Ht 5\' 6"  (1.676 m)   Wt 70.3 kg   SpO2 99%   BMI 25.01 kg/m  Pain Scale: 0-10   Pain Score: 0-No pain   SpO2: SpO2: 99 % O2 Device:SpO2: 99 % O2 Flow Rate: .O2 Flow Rate (L/min): 0 L/min  IO: Intake/output summary:   Intake/Output Summary (Last 24 hours) at 03/20/2019 1355 Last data filed at 03/20/2019 0944 Gross per 24 hour  Intake 120 ml  Output --  Net 120 ml    LBM: Last BM Date: 03/19/19 Baseline Weight: Weight: 74.8 kg Most recent weight: Weight: 70.3 kg     Palliative Assessment/Data: PPS 40%   Flowsheet Rows     Most Recent Value  Intake Tab  Referral Department  Hospitalist  Unit at Time of Referral  Med/Surg Unit  Palliative Care Primary Diagnosis  Neurology  Palliative Care Type  New Palliative care  Reason for referral  Clarify Goals of Care  Date first seen by Palliative Care  03/20/19  Clinical Assessment  Palliative Performance Scale Score  40%  Psychosocial & Spiritual Assessment  Palliative Care Outcomes  Patient/Family meeting held?  Yes  Who was at the meeting?  sister via telephone  Palliative Care Outcomes  Clarified goals of care, Counseled regarding hospice, Provided end of life care assistance, Provided psychosocial or spiritual support, Linked to palliative care logitudinal support, ACP counseling assistance      Time In/Out: 1030-1050, 1300-1340 Time  Total:25min Greater than 50%  of this time was spent counseling and coordinating care related to the above assessment and plan.  Signed by:  Ihor Dow, DNP, FNP-C Palliative Medicine Team  Phone: [phone] Fax: [phone]  Please contact Palliative Medicine Team phone at [phone] for questions and concerns.  For individual provider: See Shea Evans

## 2019-03-20 NOTE — Care Management (Signed)
Patient received a halted PASSR level II, [id number] H.

## 2019-03-20 NOTE — Discharge Instructions (Signed)
Information on my medicine - ELIQUIS (apixaban)  This medication education was reviewed with me or my healthcare representative as part of my discharge preparation.   Why was Eliquis prescribed for you? Eliquis was prescribed to treat blood clots that may have been found in the veins of your legs (deep vein thrombosis) or in your lungs (pulmonary embolism) and to reduce the risk of them occurring again.  What do You need to know about Eliquis ? The starting dose is 10 mg (two 5 mg tablets) taken TWICE daily for the FIRST SEVEN (7) DAYS, then on (enter date)  03/27/19  the dose is reduced to ONE 5 mg tablet taken TWICE daily.  Eliquis may be taken with or without food.   Try to take the dose about the same time in the morning and in the evening. If you have difficulty swallowing the tablet whole please discuss with your pharmacist how to take the medication safely.  Take Eliquis exactly as prescribed and DO NOT stop taking Eliquis without talking to the doctor who prescribed the medication.  Stopping may increase your risk of developing a new blood clot.  Refill your prescription before you run out.  After discharge, you should have regular check-up appointments with your healthcare provider that is prescribing your Eliquis.    What do you do if you miss a dose? If a dose of ELIQUIS is not taken at the scheduled time, take it as soon as possible on the same day and twice-daily administration should be resumed. The dose should not be doubled to make up for a missed dose.  Important Safety Information A possible side effect of Eliquis is bleeding. You should call your healthcare provider right away if you experience any of the following: ? Bleeding from an injury or your nose that does not stop. ? Unusual colored urine (red or dark brown) or unusual colored stools (red or black). ? Unusual bruising for unknown reasons. ? A serious fall or if you hit your head (even if there is no  bleeding).  Some medicines may interact with Eliquis and might increase your risk of bleeding or clotting while on Eliquis. To help avoid this, consult your healthcare provider or pharmacist prior to using any new prescription or non-prescription medications, including herbals, vitamins, non-steroidal anti-inflammatory drugs (NSAIDs) and supplements.  This website has more information on Eliquis (apixaban): http://www.eliquis.com/eliquis/home

## 2019-03-20 NOTE — Evaluation (Signed)
Occupational Therapy Evaluation Patient Details Name: Shirley [name] MRN: [id number] DOB: December 14, 1950 Today's Date: 03/20/2019    History of Present Illness Shirley [name] Shirley [name] is a 69 y.o. female with medical history significant of Alzheimer's dementia who presented to the ER with worsening altered mental status.History has been obtained from the ER record and the family member as the patient has altered mental status and baseline dementia and is unable to provide history.Patient was diagnosed with dementia about 8 years ago. She has had progressively worsened dementia over the last year and over last 3 months her symptoms have gotten worse with increased confusion, decreased PO intake, increased physical deconditioning. Patient's sister reports she has noticed that the patient was using her right arm less than usual over the last 2 to 3 days.  She went to see the patient's primary care provider who sent her in for evaluation of possible stroke.Patient sister usually feeds on with protein shakes at home due to decreased p.o. intake in general but constant she has been choking on the protein shakes over the last several days.No fever, chills, shortness of breath, nausea, vomiting, abdominal pain, diarrhea reported.  Sister has not noticed patient deviation but has noticed weakness use of the right upper extremity as noted above.   Clinical Impression   Patient in bed upon therapy arrival and responded to her name. Unable to provide any background information due to cognitive deficits. Based on chart review, patient is cared for by Sister and she provides total care. Patient is bedridden. Per chart, family is unable to provide the appropriate amount of care. Patient functionally appears to be at baseline. Due to level of cognitive deficits she would not benefit from any skilled therapy although I do recommend discharging to a nursing facility as she does require 24 hour care and supervision. Thank you for the  referral.     Follow Up Recommendations  Other (comment);Supervision/Assistance - 24 hour(Nursing facility)          Precautions / Restrictions Precautions Precautions: Fall Precaution Comments: dementia, oriented x1 Restrictions Weight Bearing Restrictions: No      Mobility Bed Mobility Overal bed mobility: Needs Assistance Bed Mobility: Rolling Rolling: Max assist              Balance Overall balance assessment: Needs assistance Sitting-balance support: Feet supported;Bilateral upper extremity supported Sitting balance-Leahy Scale: Fair Sitting balance - Comments: seated EOB         ADL either performed or assessed with clinical judgement   ADL Overall ADL's : At baseline;Needs assistance/impaired Eating/Feeding: Supervision/ safety;Set up;Minimal assistance;Bed level   Grooming: Wash/dry hands;Wash/dry face;Set up;Minimal assistance;Bed level   Upper Body Bathing: Maximal assistance;Bed level   Lower Body Bathing: Total assistance;Bed level   Upper Body Dressing : Maximal assistance;Bed level   Lower Body Dressing: Total assistance;Bed level     Toilet Transfer Details (indicate cue type and reason): Not completed           General ADL Comments: Total assist for toilet hygiene at bed leve.                  Pertinent Vitals/Pain Pain Assessment: Faces Faces Pain Scale: Hurts even more Pain Location: left hip when seated on EOB. Pain Descriptors / Indicators: Grimacing Pain Intervention(s): Repositioned     Hand Dominance Right(based on observation during functional task)   Extremity/Trunk Assessment Upper Extremity Assessment Upper Extremity Assessment: Generalized weakness   Lower Extremity Assessment Lower Extremity Assessment: Defer to PT  evaluation       Communication Communication Communication: Expressive difficulties   Cognition Arousal/Alertness: Awake/alert Behavior During Therapy: Flat affect Overall Cognitive Status:  History of cognitive impairments - at baseline                    [place] expects to be discharged to:: [place]: Other relatives(Daughter) Available Help at Discharge: Family;Available 24 hours/day Type of Home: House Home Access: Stairs to enter [employer] of Steps: 1-2   Home Layout: One level               Home Equipment: None   Additional Comments: Information obtained from chart as patient is unable to supply any information. Per chart, patient remains bedridden and family is unable to provide the care she needs.      Prior Functioning/Environment Level of Independence: Needs assistance  Gait / Transfers Assistance Needed: Per chart, patient is nonambulatory and bedridden. ADL's / Homemaking Assistance Needed: Total Assist due to cognition for basic ADL tasks.                                      AM-PAC OT "6 Clicks" Daily Activity     Outcome Measure Help from another person eating meals?: A Lot Help from another person taking care of personal grooming?: A Lot Help from another person toileting, which includes using toliet, bedpan, or urinal?: Total Help from another person bathing (including washing, rinsing, drying)?: Total Help from another person to put on and taking off regular upper body clothing?: Total Help from another person to put on and taking off regular lower body clothing?: Total 6 Click Score: 8   End of Session Nurse Communication: Other (comment)(Need for new Peri Wick)  Activity Tolerance: Patient tolerated treatment well Patient left: in bed;with bed alarm set  OT Visit Diagnosis: Muscle weakness (generalized) (M62.81)                Time: [id number] OT Time Calculation (min): 38 min Charges:  OT General Charges $OT Visit: 1 Visit OT Evaluation $OT Eval Low Complexity: 1 Low  Ailene Ravel, OTR/L,CBIS  [phone]  Melisa Donofrio, Clarene Duke 03/20/2019, 9:09 AM

## 2019-03-21 LAB — BASIC METABOLIC PANEL
Anion gap: 10 (ref 5–15)
BUN: 30 mg/dL — ABNORMAL HIGH (ref 8–23)
CO2: 23 mmol/L (ref 22–32)
Calcium: 9.3 mg/dL (ref 8.9–10.3)
Chloride: 105 mmol/L (ref 98–111)
Creatinine, Ser: 1.34 mg/dL — ABNORMAL HIGH (ref 0.44–1.00)
GFR calc Af Amer: 48 mL/min — ABNORMAL LOW (ref >60)
GFR calc non Af Amer: 41 mL/min — ABNORMAL LOW (ref >60)
Glucose, Bld: 98 mg/dL (ref 70–99)
Potassium: 4.1 mmol/L (ref 3.5–5.1)
Sodium: 138 mmol/L (ref 135–145)

## 2019-03-21 LAB — RESPIRATORY PANEL BY RT PCR (FLU A&B, COVID)
Influenza A by PCR: NEGATIVE
Influenza B by PCR: NEGATIVE
SARS Coronavirus 2 by RT PCR: NEGATIVE

## 2019-03-21 MED ORDER — APIXABAN 5 MG PO TABS: 10.0000 mg | ORAL_TABLET | Freq: Two times a day (BID) | ORAL | 0 refills | Status: AC

## 2019-03-21 MED ORDER — ENSURE ENLIVE PO LIQD: 237.0000 mL | Freq: Two times a day (BID) | ORAL | 12 refills | Status: AC

## 2019-03-21 MED ORDER — APIXABAN 5 MG PO TABS: 5.0000 mg | ORAL_TABLET | Freq: Two times a day (BID) | ORAL | 3 refills | Status: AC

## 2019-03-21 NOTE — Progress Notes (Signed)
Physical Therapy Treatment Patient Details Name: Shirley [name] MRN: [id number] DOB: [birth year] Today's Date: 03/21/2019    History of Present Illness Shirley [name] is a 69 y.o. female with medical history significant of Alzheimer's dementia who presented to the ER with worsening altered mental status.History has been obtained from the ER record and the family member as the patient has altered mental status and baseline dementia and is unable to provide history.Patient was diagnosed with dementia about 8 years ago. She has had progressively worsened dementia over the last year and over last 3 months her symptoms have gotten worse with increased confusion, decreased PO intake, increased physical deconditioning. Patient's sister reports she has noticed that the patient was using her right arm less than usual over the last 2 to 3 days.  She went to see the patient's primary care provider who sent her in for evaluation of possible stroke.Patient sister usually feeds on with protein shakes at home due to decreased p.o. intake in general but constant she has been choking on the protein shakes over the last several days.No fever, chills, shortness of breath, nausea, vomiting, abdominal pain, diarrhea reported.  Sister has not noticed patient deviation but has noticed weakness use of the right upper extremity as noted above.    PT Comments    Patient demonstrates slow labored movement for sitting up at bedside requiring repeated verbal/tactile cueing to follow instructions, require active assistance to complete BLE ROM/strengthening exercises while seated at bedside and limited to a few steps at bedside due to weakness, poor standing balance and fall risk.  Patient tolerated sitting up in chair after therapy - nursing staff notified.  Patient will benefit from continued physical therapy in hospital and recommended venue below to increase strength, balance, endurance for safe ADLs and gait.    Follow Up  Recommendations  SNF     Equipment Recommendations  None recommended by PT    Recommendations for Other Services       Precautions / Restrictions Precautions Precautions: Fall Precaution Comments: dementia, oriented x1 Restrictions Weight Bearing Restrictions: No    Mobility  Bed Mobility Overal bed mobility: Needs Assistance Bed Mobility: Supine to Sit     Supine to sit: Min assist;Mod assist;HOB elevated     General bed mobility comments: slow labored movement with requring frequent verbal/tactile cueing  Transfers Overall transfer level: Needs assistance Equipment used: Rolling walker (2 wheeled) Transfers: Sit to/from [facility] Sit to Stand: Min assist;Mod assist Stand pivot transfers: Mod assist       General transfer comment: unsteady on feet, verbal/tactile cueing for proper hand placement during sit to stands with fair carryover  Ambulation/Gait Ambulation/Gait assistance: Mod assist Gait Distance (Feet): 5 Feet Assistive device: Rolling walker (2 wheeled) Gait Pattern/deviations: Decreased step length - right;Decreased step length - left;Decreased stride length Gait velocity: decreased   General Gait Details: limited to 5-6 slow unsteady side steps due to weakness, poor standing balance, fall risk   Stairs             Wheelchair Mobility    Modified Rankin (Stroke Patients Only)       Balance Overall balance assessment: Needs assistance Sitting-balance support: Feet supported;No upper extremity supported Sitting balance-Leahy Scale: Fair Sitting balance - Comments: seated EOB   Standing balance support: Bilateral upper extremity supported;During functional activity Standing balance-Leahy Scale: Poor Standing balance comment: fair/poor using RW  Cognition Arousal/Alertness: Awake/alert Behavior During Therapy: Flat affect Overall Cognitive Status: History of cognitive  impairments - at baseline                                        Exercises General Exercises - Lower Extremity Long Arc Quad: Seated;AAROM;Strengthening;Both;10 reps Hip Flexion/Marching: Seated;AAROM;Strengthening;Both;10 reps    General Comments        Pertinent Vitals/Pain Pain Assessment: No/denies pain    Home Living                      Prior Function            PT Goals (current goals can now be found in the care plan section) Acute Rehab PT Goals Patient Stated Goal: return home PT Goal Formulation: With patient Time For Goal Achievement: 03/31/19 Potential to Achieve Goals: Good Progress towards PT goals: Progressing toward goals    Frequency    Min 3X/week      PT Plan Current plan remains appropriate    Co-evaluation              AM-PAC PT "6 Clicks" Mobility   Outcome Measure  Help needed turning from your back to your side while in a flat bed without using bedrails?: A Little Help needed moving from lying on your back to sitting on the side of a flat bed without using bedrails?: A Little Help needed moving to and from a bed to a chair (including a wheelchair)?: A Lot Help needed standing up from a chair using your arms (e.g., wheelchair or bedside chair)?: A Little Help needed to walk in hospital room?: A Lot Help needed climbing 3-5 steps with a railing? : A Lot 6 Click Score: 15    End of Session Equipment Utilized During Treatment: Gait belt Activity Tolerance: Patient tolerated treatment well;Patient limited by fatigue;Patient limited by pain Patient left: in chair;with chair alarm set;with call bell/phone within reach Nurse Communication: Mobility status PT Visit Diagnosis: Unsteadiness on feet (R26.81);Other abnormalities of gait and mobility (R26.89);Muscle weakness (generalized) (M62.81)     Time: [id number] PT Time Calculation (min) (ACUTE ONLY): 25 min  Charges:  $Therapeutic Activity: 23-37  mins                     2:22 PM, 03/21/19 Lonell Grandchild, MPT Physical Therapist with [facility] [phone] office [phone] mobile phone

## 2019-03-21 NOTE — Plan of Care (Signed)

## 2019-03-21 NOTE — TOC Transition Note (Signed)
Transition of Care [facility]) - CM/SW Discharge Note   Patient Details  Name: Shirley [name] MRN: [id number] Date of Birth: 1950-12-22  [facility] CM/SW Contact:  Shade Flood, LCSW Phone Number: 03/21/2019, 3:21 PM   Clinical Narrative:     Pt stable for dc per MD. Received insurance auth from [place] and pt can transfer today per Marden Noble at [facility]. Updated pt's sister, Ardyth Harps, who remains in agreement with the plan. Pt will transfer by EMS. Form printed to the floor. DC clinical sent electronically.  Updated charge RN that pt can go and report can be called to [place] at [phone]  200 hall RN.  Referral made to [facility] and they will follow up after dc.  EMS contacted. There are no other TOC needs for dc.  Final next level of care: [place] Barriers to Discharge: Barriers Resolved   Patient Goals and CMS Choice        Discharge Placement              Patient chooses bed at: [facility] Patient to be transferred to facility by: EMS Name of family member notified: [name]  Sister Patient and family notified of of transfer: 03/21/19  Discharge Plan and Services                                     Social Determinants of Health (SDOH) Interventions     Readmission Risk Interventions Readmission Risk Prevention Plan 03/21/2019  Transportation Screening Complete  Medication Review (RN CM) Complete  Some recent data might be hidden

## 2019-03-21 NOTE — Discharge Summary (Signed)
Physician Discharge Summary  Shirley [name] [id number] DOB: 13-Apr-1950 DOA: 03/16/2019  PCP: Wenda Low, MD  Admit date: 03/16/2019  Discharge date: 03/21/2019  Admitted From:Home  Disposition:  SNF  Recommendations for Outpatient Follow-up:  1. Follow up with PCP in 1-2 weeks and repeat CBC and BMP in 1 week. 2. Continue on Eliquis as prescribed for treatment of right lower extremity DVT 3. Continue on Depakote as prior  Home Health: None  Equipment/Devices: None  Discharge Condition: Stable  CODE STATUS: DNR  Diet recommendation: Heart Healthy  Brief/Interim Summary: Per HPI: Shirley L Loftisis a 69 y.o.femalewith medical history significant ofAlzheimer's dementia who presented to the ER with worsening altered mental status. History has been obtained from the ER record and the family member as the patient has altered mental status and baseline dementia and is unable to provide history. Patient was diagnosed with dementia about 8 years ago.  She has had progressively worsened dementia over the last year and over last 3 months her symptoms have gotten worse with increased confusion, decreased PO intake, increased physical deconditioning.  Patient's sister reportsshe has noticed that the patient was using her right arm less than usual over the last 2 to 3 days. She went to see the patient's primary care provider who sent her in for evaluation of possible stroke. Patient sister usually feeds on with protein shakes at home due to decreased p.o. intake in general but constant she has been choking on the protein shakes over the last several days. No fever, chills, shortness of breath, nausea, vomiting, abdominal pain, diarrhea reported. Sister has not noticed patient deviation but has noticed weakness use of the right upper extremity as noted above.  2/26:Patient was admitted with acute encephalopathy in the setting of Alzheimer's dementia and has been noted to have  decreased p.o. intake. She was noted to be dehydrated and was given IV fluid. She also has AKI. Work-up thus far has been unrevealing from a neurological standpoint with a brain MRI demonstrating no acute CVA. She does appear to have some hazy infiltrates to the right side of her lung suspicious for aspiration especially given her poor SLP evaluation. I will start her on IV Zosyn given some procalcitonin elevation and continue to monitor for ongoing improvement. PT recommending SNF on discharge which sister is agreeable to.  2/27:Patient has had agitation overnight requiring Haldol and some restraints. She has had her IV replaced with fluids and Zosyn going. She appears to be more calm and responsive to questioning this morning although she still appears disoriented.  2/28:Patient continues to remain in restraints this morning and is quite somnolent. Laboratory data unremarkable otherwise. She does not appear to have required any Haldol overnight. We will try to remove restraints today and see how well she does.  3/1: Patient continues to remain confused and as more awake this morning.  Right lower extremity swelling noted for which ultrasound performed with clot noted that is near occlusive.  She appears to have good perfusion to her lower extremity.  Will be started on Eliquis for anticoagulation.  3/2: Patient noted to be stable for discharge and will need to remain on Eliquis likely indefinitely at this point as this appears unprovoked.  Palliative has seen patient and discussed situation of severe dementia and possible failure to progress with inpatient rehabilitation with sister.  She understands that patient may require hospice evaluation in the outpatient setting should she not progress any further with rehab.  She unfortunately continues to have  ongoing confusion, but is stable for discharge.  No other acute events noted throughout the course of this admission.  Creatinine has down  trended to 1.34.  She no longer requires any further antibiotics as she has completed a 5-day course which appears adequate.  Discharge Diagnoses:  Principal Problem:   Altered mental status Active Problems:   GERD   Irritable bowel syndrome   OSTEOARTHRITIS, LOWER LEG   DEGENERATIVE DISC DISEASE, LUMBAR SPINE   Backache   Dementia (HCC)   Renal failure   CVA (cerebral vascular accident) ([place])   AMS (altered mental status)   Palliative care by specialist   Goals of care, counseling/discussion   Generalized weakness   Dehydration  Principal discharge diagnosis: Acute metabolic encephalopathy with progressive dementia-multifactorial in the setting of AKI and aspiration pneumonia.  Right lower extremity near occlusive DVT.  Discharge Instructions  Discharge Instructions    Diet - low sodium heart healthy   Complete by: As directed    Increase activity slowly   Complete by: As directed      Allergies as of 03/21/2019      Reactions   Morphine And Related Anaphylaxis   Buspirone Hcl Other (See Comments)   Unknown    Celecoxib Other (See Comments)   Unknown    Cephalexin Other (See Comments)   Unknown    Enalapril Maleate Other (See Comments)   Unknown    Lithium    Prednisone Other (See Comments)   Unknown    Rofecoxib Other (See Comments)   Vioxx- Unknown    Tylenol [acetaminophen] Other (See Comments)   Unknown       Medication List    TAKE these medications   apixaban 5 MG Tabs tablet Commonly known as: ELIQUIS Take 2 tablets (10 mg total) by mouth 2 (two) times daily for 10 days.   apixaban 5 MG Tabs tablet Commonly known as: ELIQUIS Take 1 tablet (5 mg total) by mouth 2 (two) times daily. Start taking on: March 31, 2019   divalproex 125 MG DR tablet Commonly known as: DEPAKOTE Take 125 mg by mouth 2 (two) times daily.   feeding supplement (ENSURE ENLIVE) Liqd Take 237 mLs by mouth 2 (two) times daily between meals.       Contact information for  follow-up providers    Wenda Low, MD Follow up in 1 week(s).   Specialty: Internal Medicine Contact information: [address] [place] [address] [phone]            Contact information for after-discharge care    [facility] .   Service: Skilled Nursing Contact information: [address] [place] [phone]                 Allergies  Allergen Reactions  . Morphine And Related Anaphylaxis  . Buspirone Hcl Other (See Comments)    Unknown   . Celecoxib Other (See Comments)    Unknown   . Cephalexin Other (See Comments)    Unknown   . Enalapril Maleate Other (See Comments)    Unknown   . Lithium   . Prednisone Other (See Comments)    Unknown   . Rofecoxib Other (See Comments)    Vioxx- Unknown   . Tylenol [Acetaminophen] Other (See Comments)    Unknown     Consultations:  Palliative care   Procedures/Studies: CT Head Wo Contrast  Result Date: 03/16/2019 CLINICAL DATA:  Right-sided  weakness since 03/15/2019. EXAM: CT HEAD WITHOUT CONTRAST TECHNIQUE: Contiguous axial images were obtained from the base of the skull through the vertex without intravenous contrast. COMPARISON:  April 22, 2010 FINDINGS: Brain: No evidence of acute infarction, hemorrhage, hydrocephalus, extra-axial collection or mass lesion/mass effect. Atrophy and chronic microvascular ischemic changes are noted. Vascular: No hyperdense vessel or unexpected calcification. Skull: Normal. Negative for fracture or focal lesion. Sinuses/Orbits: No acute finding. Other: None. IMPRESSION: 1. Motion degraded study. 2. No acute abnormality. 3. Atrophy and chronic microvascular ischemic changes are noted. Electronically Signed   By: Constance Holster M.D.   On: 03/16/2019 18:15   MR BRAIN WO CONTRAST  Result Date: 03/17/2019 CLINICAL DATA:  Mental status change.  Neuro deficit. EXAM: MRI HEAD WITHOUT CONTRAST TECHNIQUE:  Multiplanar, multiecho pulse sequences of the brain and surrounding structures were obtained without intravenous contrast. COMPARISON:  CT head 03/16/2019 FINDINGS: Incomplete study. Images significantly degraded by motion. The patient was not able to complete the study Negative for acute infarct on motion degraded diffusion weighted imaging. Generalized atrophy. Periventricular white matter hyperintensity most likely due to chronic ischemia. No mass or midline shift. IMPRESSION: Negative for acute infarct Incomplete study which is degraded by significant motion. Electronically Signed   By: Franchot Gallo M.D.   On: 03/17/2019 08:51   US Venous Img Lower Unilateral Right (DVT)  Result Date: 03/20/2019 CLINICAL DATA:  Right lower extremity pain. History of previous DVT. Evaluate for acute or chronic DVT. EXAM: RIGHT LOWER EXTREMITY VENOUS DOPPLER ULTRASOUND TECHNIQUE: Gray-scale sonography with graded compression, as well as color Doppler and duplex ultrasound were performed to evaluate the lower extremity deep venous systems from the level of the common femoral vein and including the common femoral, femoral, profunda femoral, popliteal and calf veins including the posterior tibial, peroneal and gastrocnemius veins when visible. The superficial great saphenous vein was also interrogated. Spectral Doppler was utilized to evaluate flow at rest and with distal augmentation maneuvers in the common femoral, femoral and popliteal veins. COMPARISON:  Right lower extremity venous Doppler ultrasound-09/19/2007 FINDINGS: Contralateral Common Femoral Vein: Respiratory phasicity is normal and symmetric with the symptomatic side. No evidence of thrombus. Normal compressibility. Common Femoral Vein: No evidence of thrombus. Normal compressibility, respiratory phasicity and response to augmentation. Saphenofemoral Junction: No evidence of thrombus. Normal compressibility and flow on color Doppler imaging. Profunda Femoral Vein:  No evidence of thrombus. Normal compressibility and flow on color Doppler imaging. Femoral Vein: While the proximal aspect of the right femoral vein appears patent (image 33), there is hypoechoic near occlusive thrombus involving the mid (image 38) and distal (image 47) aspects of the right femoral vein. Popliteal Vein: There is hypoechoic expansile occlusive thrombus within the right popliteal vein (images 52 and 58). Calf Veins: Appear patent where imaged. Superficial Great Saphenous Vein: No evidence of thrombus. Normal compressibility. Other Findings:  None. IMPRESSION: Examination is positive for occlusive/near occlusive DVT involving the mid and distal aspects of the right femoral vein extending through the right popliteal vein. Electronically Signed   By: Sandi Mariscal M.D.   On: 03/20/2019 13:18   DG Chest Port 1 View  Result Date: 03/16/2019 CLINICAL DATA:  Initial evaluation for possible aspiration. EXAM: PORTABLE CHEST 1 VIEW COMPARISON:  Prior radiograph from 02/11/2005. FINDINGS: Patient is rotated to the right. Mild cardiomegaly, similar to previous. Smoothly margined opacity along the right paratracheal stripe favored to reflect mediastinal vasculature related to patient rotation. Lungs mildly hypoinflated. Mild linear atelectatic changes noted within the  retrocardiac left lower lobe. Asymmetric patchy and hazy opacity at the right lung base favored to reflect atelectasis and/or prominent vasculature related to patient rotation. Possible infiltrate could be considered in the correct clinical setting. No edema or effusion. No pneumothorax. No acute osseous finding. IMPRESSION: 1. Asymmetric patchy and hazy opacity at the right lung base. Atelectasis and/or asymmetric prominence of the pulmonary markings is favored given that the patient is rotated to the right. Possible infiltrate difficult to exclude, and could be considered in the correct clinical setting. 2. Mild linear atelectatic changes within  the retrocardiac left lower lobe. Electronically Signed   By: Jeannine Boga M.D.   On: 03/16/2019 22:52   ECHOCARDIOGRAM COMPLETE  Result Date: 03/17/2019    ECHOCARDIOGRAM REPORT   Patient Name:   JALYLA FISCUS Date of Exam: 03/17/2019 Medical Rec #:  [id number]       Height:       66.0 in Accession #:    [id number]      Weight:       155.0 lb Date of Birth:  13-Oct-1950       BSA:          1.794 m Patient Age:    69 years        BP:           135/82 mmHg Patient Gender: F               HR:           80 bpm. Exam Location:  [facility] Procedure: 2D Echo Indications:    Stroke 434.91 / I163.9  History:        Patient has no prior history of Echocardiogram examinations.                 Stroke; Risk Factors:Non-Smoker. GERD, Renal Failure,                 Alzheimer's dementia.  Sonographer:    Leavy Cella RDCS (AE) Referring Phys: [place]  1. Left ventricular ejection fraction, by estimation, is 50 to 55%. The left ventricle has low normal function. The left ventricle has no regional wall motion abnormalities. There is mild concentric left ventricular hypertrophy. Left ventricular diastolic parameters are consistent with Grade I diastolic dysfunction (impaired relaxation).  2. Right ventricular systolic function is normal. The right ventricular size is normal.  3. The mitral valve is grossly normal. Trivial mitral valve regurgitation.  4. The aortic valve is tricuspid. Aortic valve regurgitation is mild. No aortic stenosis is present.  5. The inferior vena cava is normal in size with greater than 50% respiratory variability, suggesting right atrial pressure of 3 mmHg. FINDINGS  Left Ventricle: Left ventricular ejection fraction, by estimation, is 50 to 55%. The left ventricle has low normal function. The left ventricle has no regional wall motion abnormalities. The left ventricular internal cavity size was normal in size. There is mild concentric left ventricular hypertrophy.  Left ventricular diastolic parameters are consistent with Grade I diastolic dysfunction (impaired relaxation). Indeterminate filling pressures. Right Ventricle: The right ventricular size is normal. No increase in right ventricular wall thickness. Right ventricular systolic function is normal. Left Atrium: Left atrial size was normal in size. Right Atrium: Right atrial size was normal in size. Pericardium: There is no evidence of pericardial effusion. Mitral Valve: The mitral valve is grossly normal. Mild mitral annular calcification. Trivial mitral valve regurgitation. Tricuspid Valve: The tricuspid valve is grossly normal.  Tricuspid valve regurgitation is trivial. Aortic Valve: The aortic valve is tricuspid. Aortic valve regurgitation is mild. No aortic stenosis is present. Pulmonic Valve: The pulmonic valve was grossly normal. Pulmonic valve regurgitation is not visualized. Aorta: The aortic root is normal in size and structure. Venous: The inferior vena cava is normal in size with greater than 50% respiratory variability, suggesting right atrial pressure of 3 mmHg. IAS/Shunts: No atrial level shunt detected by color flow Doppler.  LEFT VENTRICLE PLAX 2D LVIDd:         4.13 cm  Diastology LVIDs:         3.28 cm  LV e' lateral:   5.77 cm/s LV PW:         1.42 cm  LV E/e' lateral: 11.4 LV IVS:        1.11 cm  LV e' medial:    5.11 cm/s LVOT diam:     2.10 cm  LV E/e' medial:  12.8 LVOT Area:     3.46 cm  RIGHT VENTRICLE RV S prime:     20.00 cm/s TAPSE (M-mode): 2.5 cm LEFT ATRIUM             Index       RIGHT ATRIUM           Index LA diam:        3.70 cm 2.06 cm/m  RA Area:     15.30 cm LA Vol (A2C):   48.7 ml 27.14 ml/m RA Volume:   35.60 ml  19.84 ml/m LA Vol (A4C):   28.1 ml 15.66 ml/m LA Biplane Vol: 38.4 ml 21.40 ml/m   AORTA Ao Root diam: 3.20 cm MITRAL VALVE MV Area (PHT): 3.03 cm    SHUNTS MV Decel Time: 250 msec    Systemic Diam: 2.10 cm MV E velocity: 65.50 cm/s MV A velocity: 89.20 cm/s MV E/A  ratio:  0.73 Kate Sable MD Electronically signed by Kate Sable MD Signature Date/Time: 03/17/2019/4:04:16 PM    Final      Discharge Exam: Vitals:   03/21/19 0552 03/21/19 0826  BP: 131/82   Pulse: 88   Resp: 18   Temp: 97.6 F (36.4 C)   SpO2: 98% 96%   Vitals:   03/20/19 1407 03/20/19 2206 03/21/19 0552 03/21/19 0826  BP: (!) 151/78 126/85 131/82   Pulse: 64 82 88   Resp: 17  18   Temp: (!) 97.2 F (36.2 C) 98.2 F (36.8 C) 97.6 F (36.4 C)   TempSrc:  Oral Axillary   SpO2: 100% 99% 98% 96%  Weight:      Height:        General: Pt is alert, awake, not in acute distress, appears confused Cardiovascular: RRR, S1/S2 +, no rubs, no gallops Respiratory: CTA bilaterally, no wheezing, no rhonchi, currently room air Abdominal: Soft, NT, ND, bowel sounds + Extremities: Right lower extremity swelling with no phlegmasia noted    The results of significant diagnostics from this hospitalization (including imaging, microbiology, ancillary and laboratory) are listed below for reference.     Microbiology: Recent Results (from the past 240 hour(s))  SARS CORONAVIRUS 2 (TAT 6-24 HRS) Nasopharyngeal Nasopharyngeal Swab     Status: None   Collection Time: 03/16/19  8:28 PM   Specimen: Nasopharyngeal Swab  Result Value Ref Range Status   SARS Coronavirus 2 NEGATIVE NEGATIVE Final    Comment: (NOTE) SARS-CoV-2 target nucleic acids are NOT DETECTED. The SARS-CoV-2 RNA is generally detectable in upper and lower  respiratory specimens during the acute phase of infection. Negative results do not preclude SARS-CoV-2 infection, do not rule out co-infections with other pathogens, and should not be used as the sole basis for treatment or other patient management decisions. Negative results must be combined with clinical observations, patient history, and epidemiological information. The expected result is Negative. Fact Sheet for  Patients: SugarRoll.be Fact Sheet for Healthcare Providers: https://www.woods-mathews.com/ This test is not yet approved or cleared by the [place] FDA and  has been authorized for detection and/or diagnosis of SARS-CoV-2 by FDA under an Emergency Use Authorization (EUA). This EUA will remain  in effect (meaning this test can be used) for the duration of the COVID-19 declaration under Section 56 4(b)(1) of the Act, 21 U.S.C. section 360bbb-3(b)(1), unless the authorization is terminated or revoked sooner. Performed at [facility], [place] [address], [place], [place] [address]      Labs: BNP (last 3 results) No results for input(s): BNP in the last 8760 hours. Basic Metabolic Panel: Recent Labs  Lab 03/16/19 1646 03/18/19 0836 03/19/19 0622 03/20/19 0447 03/21/19 0449  NA 138 139 140 139 138  K 4.3 4.4 4.0 3.9 4.1  CL 105 107 108 107 105  CO2 22 22 24 23 23   GLUCOSE 174* 127* 113* 101* 98  BUN 81* 59* 47* 39* 30*  CREATININE 2.07* 1.52* 1.61* 1.40* 1.34*  CALCIUM 9.4 9.1 8.7* 9.1 9.3  MG  --   --   --  2.0  --    Liver Function Tests: No results for input(s): AST, ALT, ALKPHOS, BILITOT, PROT, ALBUMIN in the last 168 hours. No results for input(s): LIPASE, AMYLASE in the last 168 hours. Recent Labs  Lab 03/17/19 1113  AMMONIA 27   CBC: Recent Labs  Lab 03/16/19 1646 03/18/19 0836 03/19/19 0622  WBC 10.1 7.4 6.6  HGB 13.2 11.9* 10.9*  HCT 40.8 36.3 34.2*  MCV 98.6 95.5 98.3  PLT 204 198 197   Cardiac Enzymes: No results for input(s): CKTOTAL, CKMB, CKMBINDEX, TROPONINI in the last 168 hours. BNP: Invalid input(s): POCBNP CBG: Recent Labs  Lab 03/16/19 1638  GLUCAP 168*   D-Dimer No results for input(s): DDIMER in the last 72 hours. Hgb A1c No results for input(s): HGBA1C in the last 72 hours. Lipid Profile No results for input(s): CHOL, HDL, LDLCALC, TRIG, CHOLHDL, LDLDIRECT in the last 72  hours. Thyroid function studies No results for input(s): TSH, T4TOTAL, T3FREE, THYROIDAB in the last 72 hours.  Invalid input(s): FREET3 Anemia work up No results for input(s): VITAMINB12, FOLATE, FERRITIN, TIBC, IRON, RETICCTPCT in the last 72 hours. Urinalysis    Component Value Date/Time   COLORURINE YELLOW 03/16/2019 1552   APPEARANCEUR CLEAR 03/16/2019 1552   LABSPEC 1.020 03/16/2019 1552   PHURINE 5.0 03/16/2019 1552   GLUCOSEU NEGATIVE 03/16/2019 1552   HGBUR NEGATIVE 03/16/2019 1552   BILIRUBINUR NEGATIVE 03/16/2019 1552   KETONESUR NEGATIVE 03/16/2019 1552   PROTEINUR NEGATIVE 03/16/2019 1552   UROBILINOGEN 0.2 04/22/2010 1322   NITRITE NEGATIVE 03/16/2019 1552   LEUKOCYTESUR NEGATIVE 03/16/2019 1552   Sepsis Labs Invalid input(s): PROCALCITONIN,  WBC,  LACTICIDVEN Microbiology Recent Results (from the past 240 hour(s))  SARS CORONAVIRUS 2 (TAT 6-24 HRS) Nasopharyngeal Nasopharyngeal Swab     Status: None   Collection Time: 03/16/19  8:28 PM   Specimen: Nasopharyngeal Swab  Result Value Ref Range Status   SARS Coronavirus 2 NEGATIVE NEGATIVE Final    Comment: (NOTE) SARS-CoV-2 target nucleic  acids are NOT DETECTED. The SARS-CoV-2 RNA is generally detectable in upper and lower respiratory specimens during the acute phase of infection. Negative results do not preclude SARS-CoV-2 infection, do not rule out co-infections with other pathogens, and should not be used as the sole basis for treatment or other patient management decisions. Negative results must be combined with clinical observations, patient history, and epidemiological information. The expected result is Negative. Fact Sheet for Patients: SugarRoll.be Fact Sheet for Healthcare Providers: https://www.woods-mathews.com/ This test is not yet approved or cleared by the [place] FDA and  has been authorized for detection and/or diagnosis of SARS-CoV-2 by FDA  under an Emergency Use Authorization (EUA). This EUA will remain  in effect (meaning this test can be used) for the duration of the COVID-19 declaration under Section 56 4(b)(1) of the Act, 21 U.S.C. section 360bbb-3(b)(1), unless the authorization is terminated or revoked sooner. Performed at [facility], [place] [address] [place], [place] [address]      Time coordinating discharge: 35 minutes  SIGNED:   Rodena Goldmann, DO [facility] 03/21/2019, 9:59 AM  If 7PM-7AM, please contact night-coverage [email or web address]

## 2019-05-20 DEATH — deceased
